# Patient Record
Sex: Female | Born: 1950
Health system: Southern US, Community
[De-identification: ages and names within clinical notes are randomized; demographics above are authoritative.]

## PROBLEM LIST (undated history)

## (undated) DIAGNOSIS — K589 Irritable bowel syndrome without diarrhea: Secondary | ICD-10-CM

## (undated) DIAGNOSIS — K219 Gastro-esophageal reflux disease without esophagitis: Secondary | ICD-10-CM

## (undated) HISTORY — PX: ABDOMINAL HYSTERECTOMY: SHX81

## (undated) HISTORY — PX: OOPHORECTOMY: SHX86

## (undated) HISTORY — DX: Irritable bowel syndrome, unspecified: K58.9

## (undated) HISTORY — DX: Gastro-esophageal reflux disease without esophagitis: K21.9

---

## 1978-11-18 HISTORY — PX: LIPOMA EXCISION: SHX5283

## 1988-11-18 HISTORY — PX: OTHER SURGICAL HISTORY: SHX169

## 2005-03-13 ENCOUNTER — Encounter: Payer: Self-pay | Admitting: Internal Medicine

## 2005-03-13 LAB — CONVERTED CEMR LAB

## 2005-10-14 ENCOUNTER — Ambulatory Visit: Payer: Self-pay | Admitting: Internal Medicine

## 2006-01-08 ENCOUNTER — Ambulatory Visit: Payer: Self-pay | Admitting: *Deleted

## 2006-04-01 ENCOUNTER — Ambulatory Visit: Payer: Self-pay | Admitting: Internal Medicine

## 2006-04-18 ENCOUNTER — Ambulatory Visit: Payer: Self-pay | Admitting: Internal Medicine

## 2006-04-19 ENCOUNTER — Emergency Department: Payer: Self-pay | Admitting: Emergency Medicine

## 2006-04-22 LAB — HM MAMMOGRAPHY: HM Mammogram: NORMAL

## 2006-09-03 ENCOUNTER — Ambulatory Visit: Payer: Self-pay | Admitting: Internal Medicine

## 2006-09-25 ENCOUNTER — Ambulatory Visit: Payer: Self-pay | Admitting: Internal Medicine

## 2007-01-19 ENCOUNTER — Ambulatory Visit: Payer: Self-pay | Admitting: Family Medicine

## 2007-05-07 ENCOUNTER — Encounter: Payer: Self-pay | Admitting: Internal Medicine

## 2007-05-07 ENCOUNTER — Ambulatory Visit: Payer: Self-pay | Admitting: Obstetrics and Gynecology

## 2007-05-21 ENCOUNTER — Ambulatory Visit: Payer: Self-pay | Admitting: Obstetrics and Gynecology

## 2007-06-07 ENCOUNTER — Emergency Department: Payer: Self-pay | Admitting: Emergency Medicine

## 2007-06-07 ENCOUNTER — Other Ambulatory Visit: Payer: Self-pay

## 2007-06-08 ENCOUNTER — Ambulatory Visit: Payer: Self-pay | Admitting: Internal Medicine

## 2007-06-08 DIAGNOSIS — K219 Gastro-esophageal reflux disease without esophagitis: Secondary | ICD-10-CM | POA: Insufficient documentation

## 2007-06-08 DIAGNOSIS — E785 Hyperlipidemia, unspecified: Secondary | ICD-10-CM | POA: Insufficient documentation

## 2007-07-28 ENCOUNTER — Ambulatory Visit: Payer: Self-pay | Admitting: Internal Medicine

## 2008-02-29 ENCOUNTER — Telehealth (INDEPENDENT_AMBULATORY_CARE_PROVIDER_SITE_OTHER): Payer: Self-pay | Admitting: *Deleted

## 2008-03-24 ENCOUNTER — Ambulatory Visit: Payer: Self-pay | Admitting: Internal Medicine

## 2008-03-28 LAB — CONVERTED CEMR LAB
AST: 20 units/L (ref 0–37)
Albumin: 3.5 g/dL (ref 3.5–5.2)
Alkaline Phosphatase: 122 units/L — ABNORMAL HIGH (ref 39–117)
Basophils Relative: 0.6 % (ref 0.0–1.0)
Calcium: 9.4 mg/dL (ref 8.4–10.5)
Creatinine, Ser: 0.7 mg/dL (ref 0.4–1.2)
Eosinophils Relative: 2.1 % (ref 0.0–5.0)
GFR calc Af Amer: 112 mL/min
GFR calc non Af Amer: 92 mL/min
Hemoglobin: 13.4 g/dL (ref 12.0–15.0)
Lymphocytes Relative: 20.1 % (ref 12.0–46.0)
MCV: 84.1 fL (ref 78.0–100.0)
Neutro Abs: 6.3 10*3/uL (ref 1.4–7.7)
Neutrophils Relative %: 68.6 % (ref 43.0–77.0)
Phosphorus: 3.8 mg/dL (ref 2.3–4.6)
RBC: 4.72 M/uL (ref 3.87–5.11)
Sed Rate: 31 mm/hr — ABNORMAL HIGH (ref 0–22)
Sodium: 141 meq/L (ref 135–145)
Total Protein: 7 g/dL (ref 6.0–8.3)
WBC: 9.2 10*3/uL (ref 4.5–10.5)

## 2008-04-06 ENCOUNTER — Encounter: Payer: Self-pay | Admitting: Internal Medicine

## 2008-04-06 DIAGNOSIS — K589 Irritable bowel syndrome without diarrhea: Secondary | ICD-10-CM | POA: Insufficient documentation

## 2008-04-14 ENCOUNTER — Ambulatory Visit: Payer: Self-pay | Admitting: Internal Medicine

## 2008-04-14 DIAGNOSIS — M810 Age-related osteoporosis without current pathological fracture: Secondary | ICD-10-CM | POA: Insufficient documentation

## 2008-06-16 ENCOUNTER — Encounter: Payer: Self-pay | Admitting: Internal Medicine

## 2008-06-16 ENCOUNTER — Encounter: Payer: Self-pay | Admitting: Cardiovascular Disease

## 2008-06-18 IMAGING — CR DG CHEST 2V
1 series · 2 of 2 positions shown · non-contrast
Comparison: none

REASON FOR EXAM: cp
COMMENTS:

PROCEDURE:     DXR - DXR CHEST PA (OR AP) AND LATERAL  - June 07, 2007  [DATE]
RESULT:     Comparison: No available comparison exam.

[Series 1: view not recorded · 0.17mm/px · 2 of 2 slices shown]
[im 1/2]
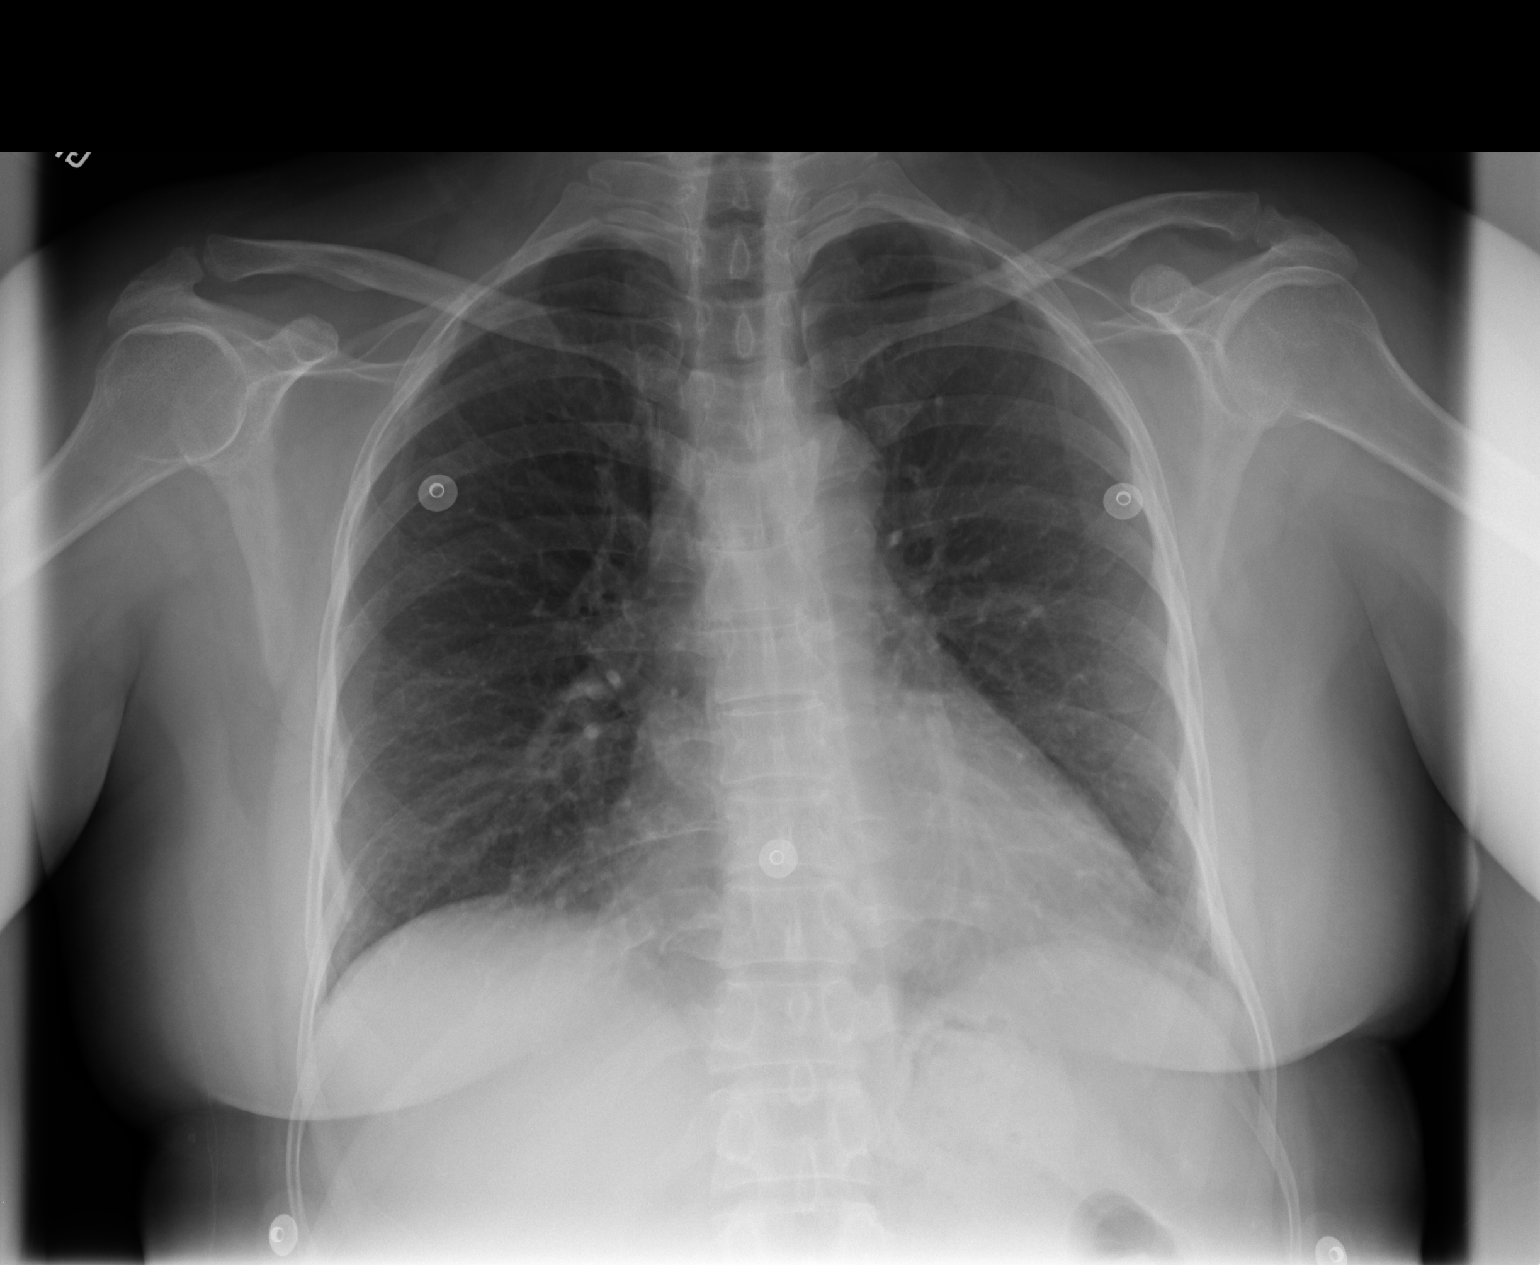
[im 2/2]
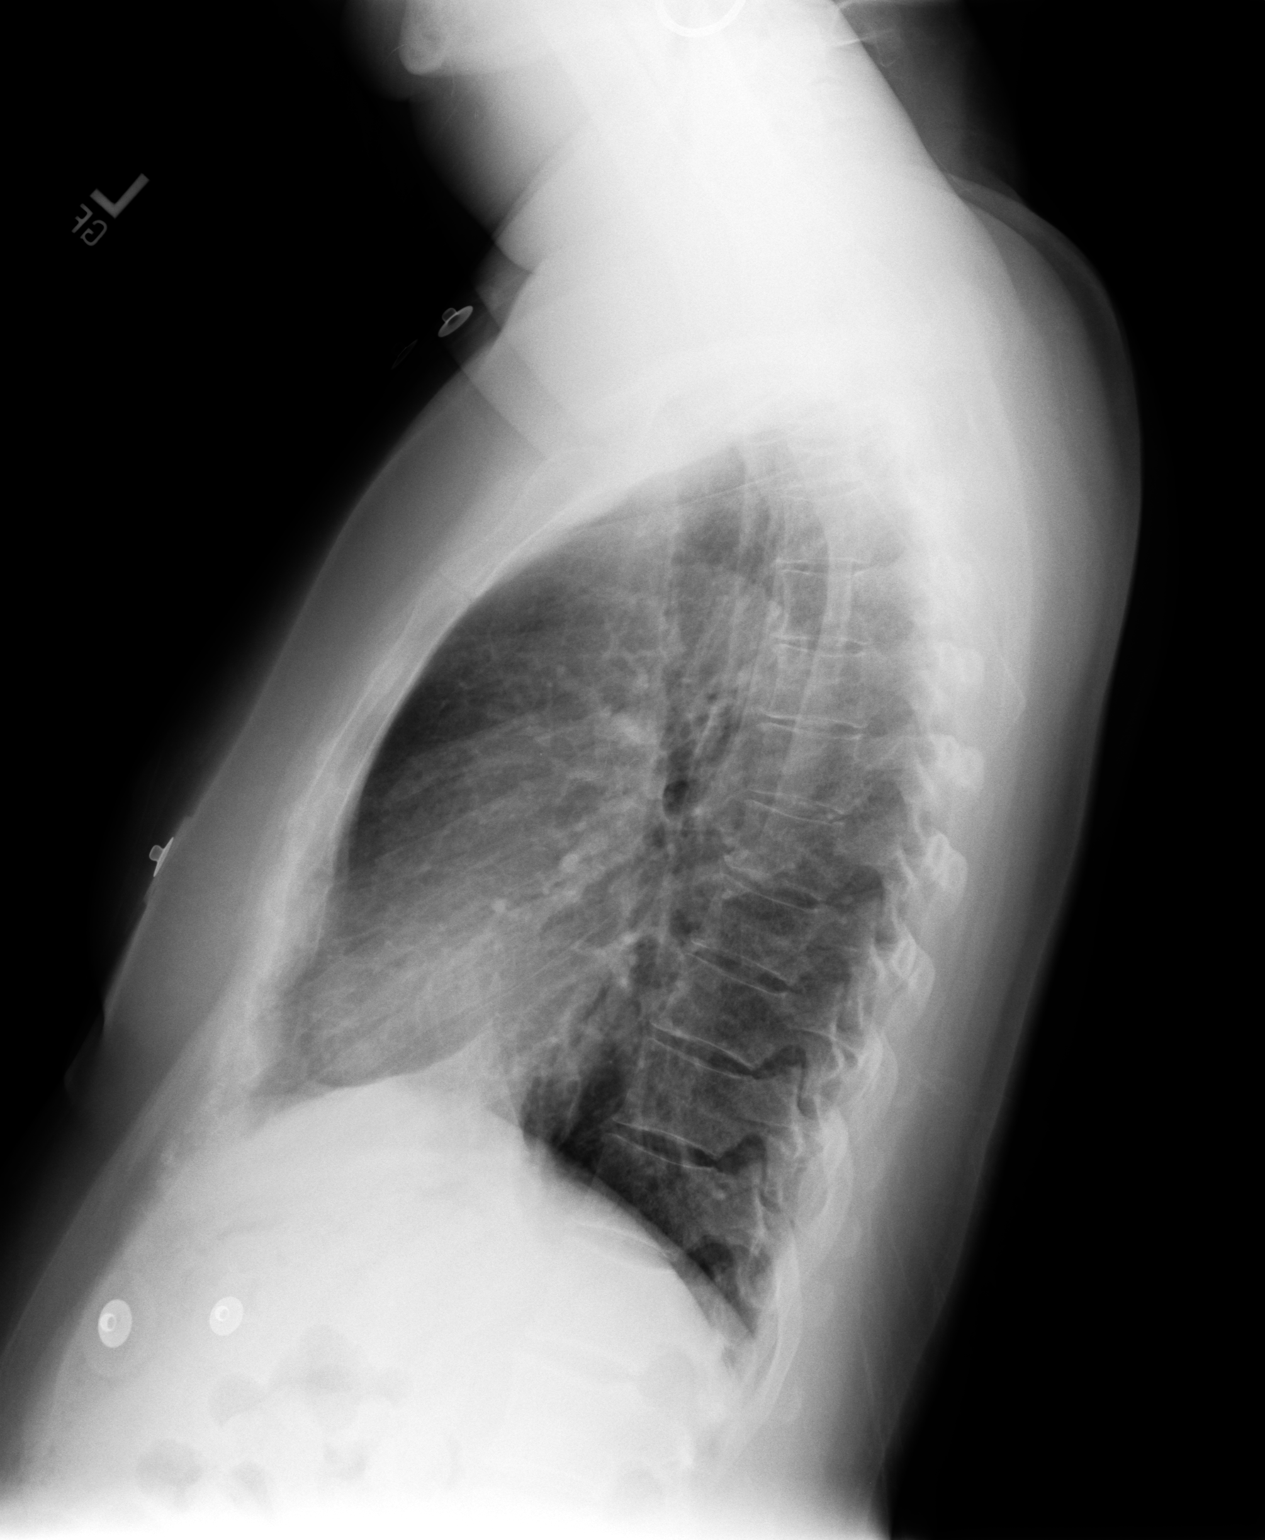

[2 of 2 positions shown; findings below may reference images not displayed]

FINDINGS: Possible emphysematous changes of the upper lungs. There is no significant
pulmonary consolidation, pulmonary edema, pleural effusion, nor
pneumothorax. The heart is borderline in size to mildly enlarged.

No displaced rib fracture is noted.
IMPRESSION: 1. Possible emphysematous changes of the upper lungs.
2. The heart is borderline sized to mildly enlarged.

## 2008-06-23 ENCOUNTER — Telehealth: Payer: Self-pay | Admitting: Internal Medicine

## 2008-07-07 ENCOUNTER — Telehealth: Payer: Self-pay | Admitting: Internal Medicine

## 2008-07-14 ENCOUNTER — Ambulatory Visit: Payer: Self-pay | Admitting: Cardiovascular Disease

## 2008-07-20 ENCOUNTER — Ambulatory Visit: Payer: Self-pay

## 2008-07-20 ENCOUNTER — Encounter: Payer: Self-pay | Admitting: Internal Medicine

## 2008-08-01 ENCOUNTER — Ambulatory Visit: Payer: Self-pay | Admitting: Internal Medicine

## 2008-08-03 LAB — CONVERTED CEMR LAB
BUN: 18 mg/dL (ref 6–23)
CO2: 30 meq/L (ref 19–32)
Chloride: 109 meq/L (ref 96–112)
Creatinine, Ser: 0.7 mg/dL (ref 0.4–1.2)
GFR calc Af Amer: 112 mL/min
Glucose, Bld: 81 mg/dL (ref 70–99)

## 2008-08-04 ENCOUNTER — Ambulatory Visit: Payer: Self-pay | Admitting: Obstetrics and Gynecology

## 2008-08-15 ENCOUNTER — Ambulatory Visit: Payer: Self-pay | Admitting: Internal Medicine

## 2008-11-14 ENCOUNTER — Ambulatory Visit: Payer: Self-pay | Admitting: Family Medicine

## 2008-12-26 ENCOUNTER — Ambulatory Visit: Payer: Self-pay | Admitting: Family Medicine

## 2009-05-01 ENCOUNTER — Ambulatory Visit: Payer: Self-pay | Admitting: Internal Medicine

## 2009-05-03 ENCOUNTER — Ambulatory Visit: Payer: Self-pay | Admitting: Obstetrics and Gynecology

## 2009-05-12 ENCOUNTER — Inpatient Hospital Stay: Payer: Self-pay | Admitting: Obstetrics and Gynecology

## 2009-08-16 IMAGING — MG MM CAD SCREENING MAMMO
1 series · 4 of 4 positions shown · non-contrast
Comparison: none

REASON FOR EXAM: Screening Mammo
COMMENTS:

PROCEDURE:     MAM - MAM DGTL SCREENING MAMMO W/CAD  - August 04, 2008  [DATE]
RESULT:       No dominant masses or pathologic clustered calcifications are
demonstrated.   The exam is stable from prior exams. CAD evaluation is
nonfocal.

[R CC · right · 4 of 4 slices shown]
[im 1/4]
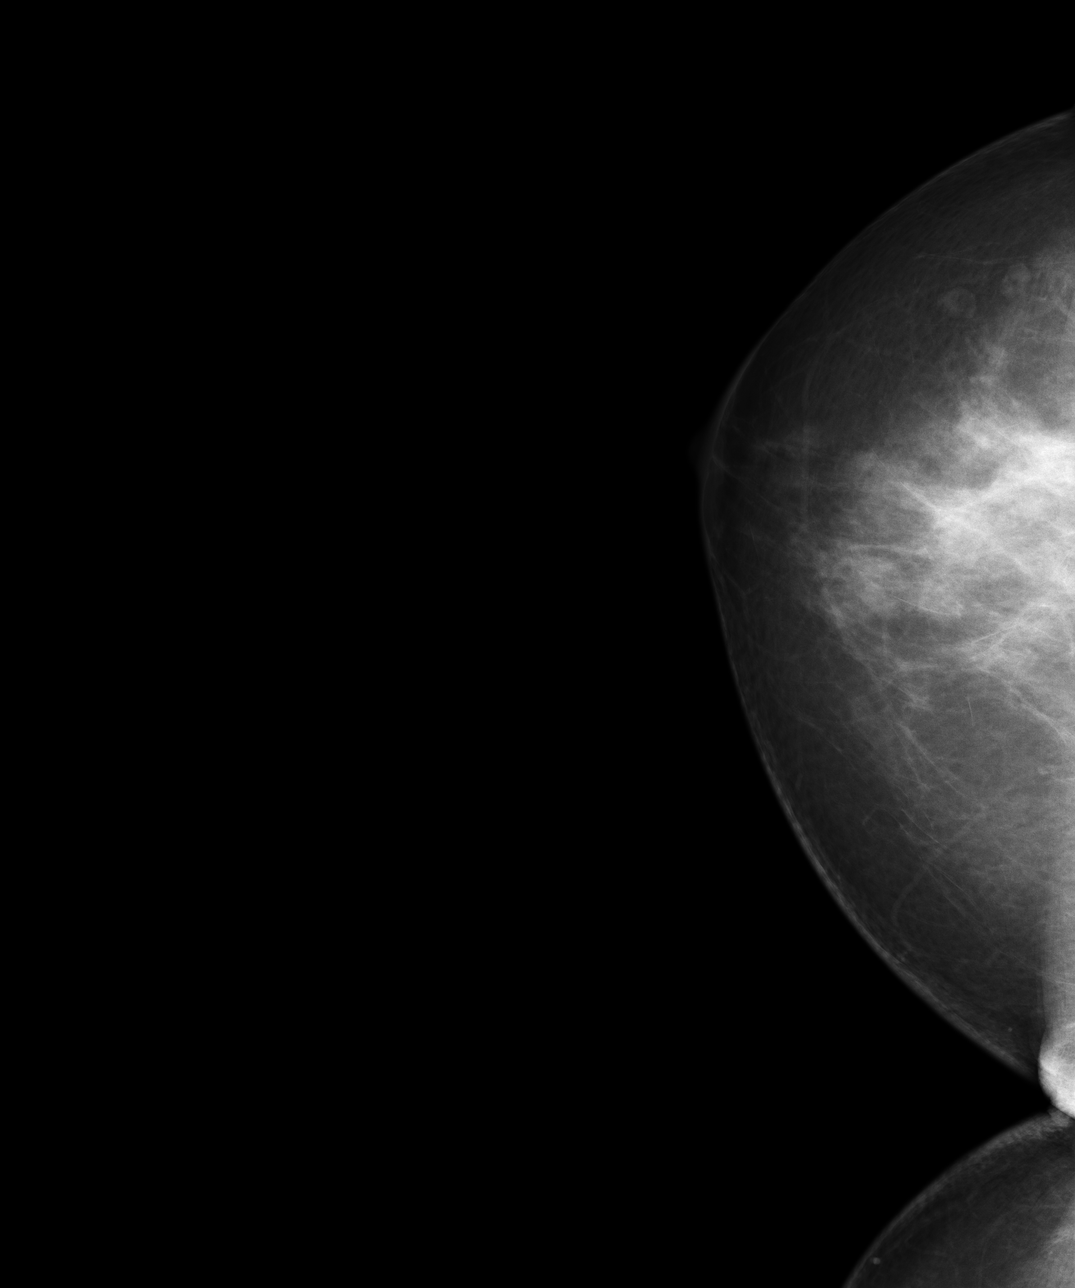
[im 2/4]
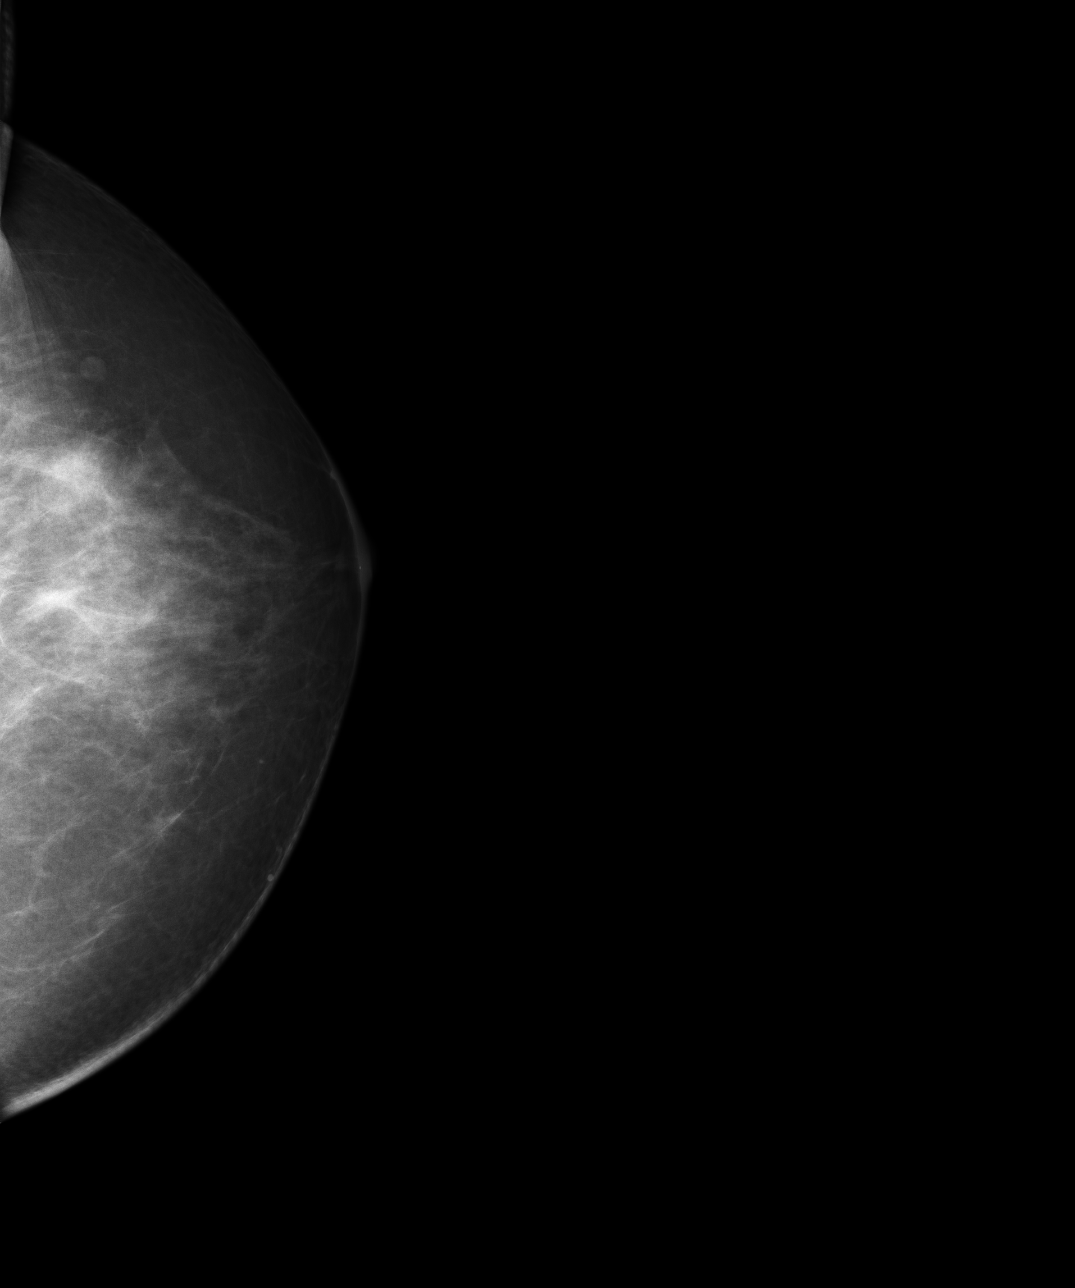
[im 3/4]
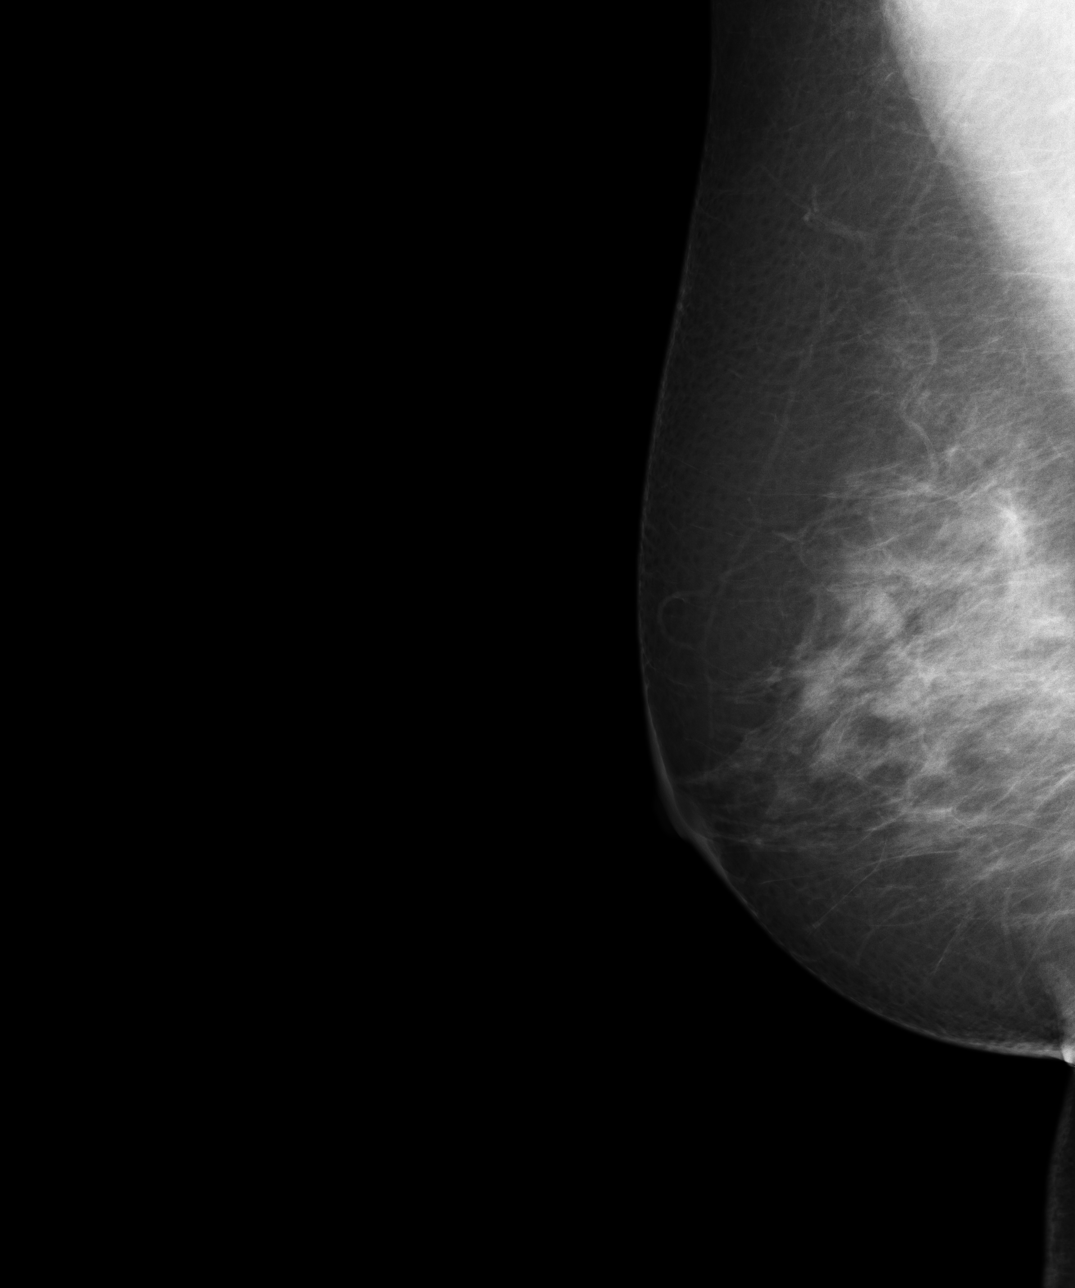
[im 4/4]
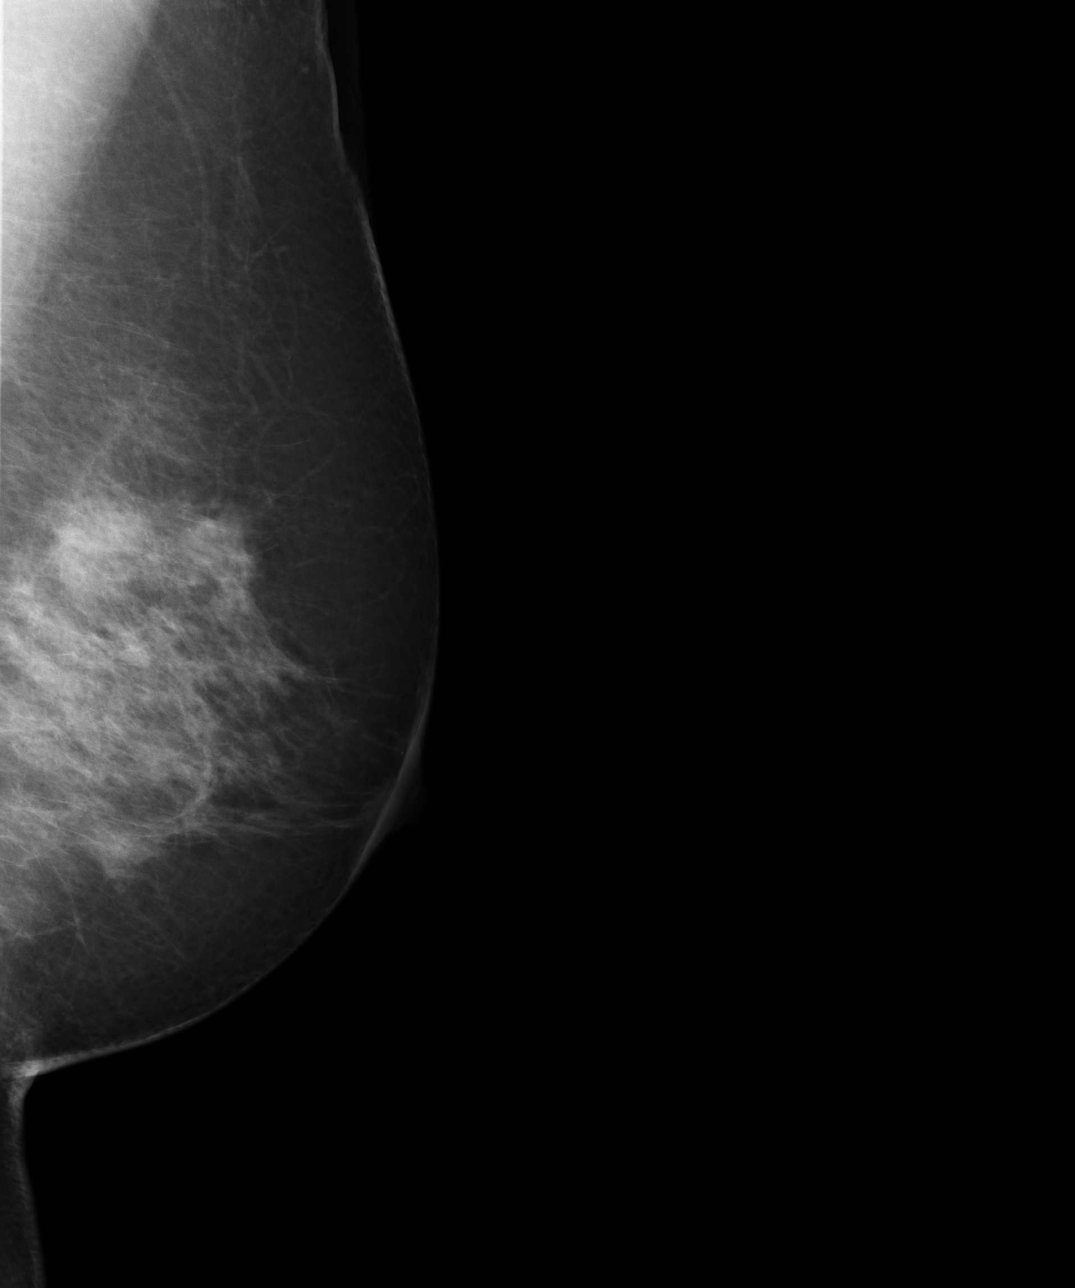

[4 of 4 positions shown; findings below may reference images not displayed]

IMPRESSION: 1.     Stable benign exam.
2.     Routine yearly follow-up mammograms suggested.
3.     BI-RADS:  Category 2-Benign Finding.

A NEGATIVE MAMMOGRAM REPORT DOES NOT PRECLUDE BIOPSY OR OTHER EVALUATION OF
A CLINICALLY PALPABLE OR OTHERWISE SUSPICIOUS MASS OR LESION.  BREAST CANCER
MAY NOT BE DETECTED BY MAMMOGRAPHY IN UP TO 10% OF CASES.

## 2009-08-24 ENCOUNTER — Telehealth (INDEPENDENT_AMBULATORY_CARE_PROVIDER_SITE_OTHER): Payer: Self-pay | Admitting: *Deleted

## 2009-08-24 ENCOUNTER — Ambulatory Visit: Payer: Self-pay | Admitting: Internal Medicine

## 2009-09-08 ENCOUNTER — Ambulatory Visit: Payer: Self-pay | Admitting: Internal Medicine

## 2009-09-11 LAB — CONVERTED CEMR LAB
ALT: 15 units/L (ref 0–35)
CO2: 23 meq/L (ref 19–32)
Calcium: 8.8 mg/dL (ref 8.4–10.5)
Chloride: 103 meq/L (ref 96–112)
Cholesterol: 224 mg/dL — ABNORMAL HIGH (ref 0–200)
Creatinine, Ser: 0.77 mg/dL (ref 0.40–1.20)
Eosinophils Absolute: 0.1 10*3/uL (ref 0.0–0.7)
Hemoglobin: 13.8 g/dL (ref 12.0–15.0)
Lymphs Abs: 2.4 10*3/uL (ref 0.7–4.0)
MCV: 84.9 fL (ref 78.0–100.0)
Monocytes Absolute: 1.1 10*3/uL — ABNORMAL HIGH (ref 0.1–1.0)
Monocytes Relative: 10 % (ref 3–12)
Neutrophils Relative %: 67 % (ref 43–77)
RBC: 4.96 M/uL (ref 3.87–5.11)
Sodium: 140 meq/L (ref 135–145)
Total Protein: 7.2 g/dL (ref 6.0–8.3)
WBC: 10.9 10*3/uL — ABNORMAL HIGH (ref 4.0–10.5)

## 2009-09-22 ENCOUNTER — Encounter: Payer: Self-pay | Admitting: Internal Medicine

## 2009-09-28 ENCOUNTER — Encounter: Payer: Self-pay | Admitting: Family Medicine

## 2009-09-28 ENCOUNTER — Ambulatory Visit: Payer: Self-pay | Admitting: Family Medicine

## 2009-10-02 ENCOUNTER — Emergency Department: Payer: Self-pay | Admitting: Emergency Medicine

## 2009-10-04 ENCOUNTER — Encounter (INDEPENDENT_AMBULATORY_CARE_PROVIDER_SITE_OTHER): Payer: Self-pay | Admitting: *Deleted

## 2009-10-05 ENCOUNTER — Ambulatory Visit: Payer: Self-pay | Admitting: Internal Medicine

## 2009-10-17 ENCOUNTER — Ambulatory Visit: Payer: Self-pay | Admitting: Internal Medicine

## 2009-10-17 LAB — HM COLONOSCOPY

## 2010-04-24 ENCOUNTER — Emergency Department: Payer: Self-pay | Admitting: Emergency Medicine

## 2010-08-14 ENCOUNTER — Ambulatory Visit: Payer: Self-pay | Admitting: Internal Medicine

## 2010-08-14 DIAGNOSIS — L570 Actinic keratosis: Secondary | ICD-10-CM | POA: Insufficient documentation

## 2010-08-15 LAB — CONVERTED CEMR LAB
Cholesterol: 248 mg/dL — ABNORMAL HIGH (ref 0–200)
Glucose, Bld: 102 mg/dL — ABNORMAL HIGH (ref 70–99)
HDL: 49 mg/dL (ref >39.00)
VLDL: 57.4 mg/dL — ABNORMAL HIGH (ref 0.0–40.0)

## 2010-08-21 ENCOUNTER — Telehealth: Payer: Self-pay | Admitting: Internal Medicine

## 2010-10-10 ENCOUNTER — Emergency Department: Payer: Self-pay | Admitting: Emergency Medicine

## 2010-12-18 NOTE — Progress Notes (Signed)
Summary: regarding mole  Phone Note Call from Patient Call back at Home Phone [phone] Call back at [phone]   Caller: Patient Call For: Cindee Salt MD Summary of Call: Pt had a mole frozen off her leg last week and she says now this looks like a big blister.  I advised pt that is not unusual. She is asking how long is it going to take for this to go away.  She said it does feel tender to the touch. Initial call taken by: Lowella Petties CMA,  August 21, 2010 10:44 AM  Follow-up for Phone Call        the blister will either break or slough off and eventually it will scab over then heal the total process is generally over in 2-3 weeks Follow-up by: Cindee Salt MD,  August 21, 2010 1:39 PM  Additional Follow-up for Phone Call Additional follow up Details #1::        Spoke with patient and advised results.  Additional Follow-up by: Mervin Hack CMA Duncan Dull),  August 21, 2010 3:03 PM

## 2010-12-18 NOTE — Assessment & Plan Note (Signed)
Summary: follow up/wants labs/alc   Vital Signs:  Patient profile:   60 year old female Weight:      164 pounds BMI:     31.10 Temp:     98.7 degrees F oral Pulse rate:   64 / minute Pulse rhythm:   regular BP sitting:   110 / 70  (left arm) Cuff size:   regular  Vitals Entered By: Mervin Hack CMA Duncan Dull) (August 14, 2010 9:10 AM) CC: follow-up visit   History of Present Illness: Has open enrollment for work Needs labs for them--chol and glucose??  Has been concerned about an enlarging mole on right calf She remembers it from way back Seems to have gotten bigger in past year Gets cut during shaving and bleeds  Occ pain in leg but doesn't seem to be related to mole No itching  Stomach has been fine hasn't needed the zantac recently No swallowing problems  No longer sees gyn  Allergies: 1)  ! Sulfa 2)  Boniva (Ibandronate Sodium)  Past History:  Past medical, surgical, family and social histories (including risk factors) reviewed for relevance to current acute and chronic problems.  Past Medical History: Reviewed history from 04/27/2009 and no changes required. GERD Hyperlipidemia IBS Osteoporosis  Past Surgical History: Reviewed history from 09/08/2009 and no changes required. C-section 1983 Left knee arthroscopy  ~1990 Left thigh lipoma 1980's EGD/Colon 07/06 Hysterectomy/BSO-----Dr Knowles-Jonas  Family History: Reviewed history from 08/01/2008 and no changes required. Dad died in 61s of COPD Mom with DM, ?atrial fib, ear tumor (?Schwanomma), skin cancer 1 sister with MVP, melanoma No CAD ?HTN in Dad No breast or colon cancer  Social History: Reviewed history from 04/27/2009 and no changes required. Occupation: full time at [employer] son Never Smoked Alcohol use-no Regular Exercise - yes  Review of Systems       No longer has cough No SOB weigth down 4#  Physical Exam  General:  alert and normal appearance.     Abdomen:  soft, non-tender, and no masses.   Skin:  slightly raised 3-80mm lesion on lateral right calf Mild redness, sl scaling Psych:  normally interactive, good eye contact, not anxious appearing, and not depressed appearing.     Impression & Recommendations:  Problem # 1:  HYPERLIPIDEMIA (ICD-272.4) Assessment Comment Only  discussed eating right and increased exercise  Labs Reviewed: SGOT: 17 (09/08/2009)   SGPT: 15 (09/08/2009)   HDL:48 (09/08/2009)  LDL:121 (09/08/2009)  Chol:224 (09/08/2009)  Trig:274 (09/08/2009)  Orders: TLB-Lipid Panel (80061-LIPID) Venipuncture (16109)  Problem # 2:  GERD (ICD-530.81) Assessment: Improved hasn't needed the meds lately no cough  Her updated medication list for this problem includes:    Zantac 150 Maximum Strength 150 Mg Tabs (Ranitidine hcl) .Marland Kitchen... 1 tablet by mouth two times a day for heartburn  Problem # 3:  ACTINIC KERATOSIS (ICD-702.0) Assessment: New  not classic appearing actinic but some changes Liquid nitrogen 40 seconds x 2---discussed home care  Orders: Cryotherapy/Destruction benign or premalignant lesion (1st lesion)  (17000)  Complete Medication List: 1)  Caltrate 600+d Plus 600-400 Mg-unit Tabs (Calcium carbonate-vit d-min) .Marland Kitchen.. 1 tab by mouth two times a day 2)  Zantac 150 Maximum Strength 150 Mg Tabs (Ranitidine hcl) .Marland Kitchen.. 1 tablet by mouth two times a day for heartburn  Other Orders: TLB-Glucose, QUANT (82947-GLU)  Patient Instructions: 1)  Please schedule a follow-up appointment in 6 months for physical 2)  Your blood pressure today was 110/70  Current Allergies (reviewed today): !  SULFA BONIVA (IBANDRONATE SODIUM)  Appended Document: follow up/wants labs/alc    Clinical Lists Changes  Orders: Added new Service order of Flu Vaccine 26yrs + [phone]) - Signed Added new Service order of Admin 1st Vaccine (27253) - Signed Added new Service order of Admin 1st Vaccine [facility]) [phone]) -  Signed Observations: Added new observation of FLU VAX#1VIS: 06/12/10 version given August 14, 2010. (08/14/2010 9:42) Added new observation of FLU VAXLOT: [id number] (08/14/2010 9:42) Added new observation of FLU VAX EXP: 05/18/2011 (08/14/2010 9:42) Added new observation of FLU VAXBY: DeShannon Smith CMA (AAMA) (08/14/2010 9:42) Added new observation of FLU VAXRTE: IM (08/14/2010 9:42) Added new observation of FLU VAX DSE: 0.5 ml (08/14/2010 9:42) Added new observation of FLU VAXMFR: GlaxoSmith[employer] (08/14/2010 9:42) Added new observation of FLU VAX SITE: left deltoid (08/14/2010 9:42) Added new observation of FLU VAX: Fluvax 3+ (08/14/2010 9:42)       Influenza Vaccine    Vaccine Type: Fluvax 3+    Site: left deltoid    Mfr: GlaxoSmithKline    Dose: 0.5 ml    Route: IM    Given by: Mervin Hack CMA (AAMA)    Exp. Date: 05/18/2011    Lot #: [id number]    VIS given: 06/12/10 version given August 14, 2010.  Flu Vaccine Consent Questions    Do you have a history of severe allergic reactions to this vaccine? no    Any prior history of allergic reactions to egg and/or gelatin? no    Do you have a sensitivity to the preservative Thimersol? no    Do you have a past history of Guillan-Barre Syndrome? no    Do you currently have an acute febrile illness? no    Have you ever had a severe reaction to latex? no    Vaccine information given and explained to patient? yes    Are you currently pregnant? no

## 2010-12-26 ENCOUNTER — Encounter: Payer: Self-pay | Admitting: Internal Medicine

## 2011-02-12 ENCOUNTER — Encounter: Payer: Self-pay | Admitting: Internal Medicine

## 2011-02-12 ENCOUNTER — Ambulatory Visit (INDEPENDENT_AMBULATORY_CARE_PROVIDER_SITE_OTHER): Payer: 59 | Admitting: Internal Medicine

## 2011-02-12 VITALS — BP 126/79 | HR 81 | Temp 98.3°F | Ht 61.0 in | Wt 163.0 lb

## 2011-02-12 DIAGNOSIS — E785 Hyperlipidemia, unspecified: Secondary | ICD-10-CM

## 2011-02-12 DIAGNOSIS — K219 Gastro-esophageal reflux disease without esophagitis: Secondary | ICD-10-CM

## 2011-02-12 DIAGNOSIS — M810 Age-related osteoporosis without current pathological fracture: Secondary | ICD-10-CM

## 2011-02-12 DIAGNOSIS — Z1231 Encounter for screening mammogram for malignant neoplasm of breast: Secondary | ICD-10-CM

## 2011-02-12 DIAGNOSIS — Z0000 Encounter for general adult medical examination without abnormal findings: Secondary | ICD-10-CM

## 2011-02-12 LAB — CBC WITH DIFFERENTIAL/PLATELET
Basophils Relative: 0.6 % (ref 0.0–3.0)
Eosinophils Relative: 1.9 % (ref 0.0–5.0)
HCT: 40.8 % (ref 36.0–46.0)
Hemoglobin: 13.7 g/dL (ref 12.0–15.0)
Lymphs Abs: 2 10*3/uL (ref 0.7–4.0)
MCV: 85.3 fl (ref 78.0–100.0)
Monocytes Absolute: 0.8 10*3/uL (ref 0.1–1.0)
Monocytes Relative: 8.5 % (ref 3.0–12.0)
Neutro Abs: 6.1 10*3/uL (ref 1.4–7.7)
RBC: 4.78 Mil/uL (ref 3.87–5.11)
WBC: 9.2 10*3/uL (ref 4.5–10.5)

## 2011-02-12 LAB — HEPATIC FUNCTION PANEL
ALT: 21 U/L (ref 0–35)
AST: 27 U/L (ref 0–37)
Total Protein: 7.1 g/dL (ref 6.0–8.3)

## 2011-02-12 LAB — BASIC METABOLIC PANEL
BUN: 18 mg/dL (ref 6–23)
Chloride: 108 mEq/L (ref 96–112)
Potassium: 5.4 mEq/L — ABNORMAL HIGH (ref 3.5–5.1)
Sodium: 146 mEq/L — ABNORMAL HIGH (ref 135–145)

## 2011-02-12 NOTE — Progress Notes (Signed)
Subjective:    Patient ID: Theresa Huber, female    DOB: November 24, 1950, 60 y.o.   MRN: [id number]  HPI DOing okay No longer seeing gyn  Having some trouble sleeping Will awake choking at night Wonders about apnea--but is choking, not apneic apparently Husband does note snoring Several times this past week Ongoing indigestion  Occ daytime lightheadedness also No syncope  Past Medical History  Diagnosis Date  . GERD (gastroesophageal reflux disease)   . Osteoporosis   . IBS (irritable bowel syndrome)   . HLD (hyperlipidemia)     Past Surgical History  Procedure Date  . Cesarean section 1983  . Abdominal hysterectomy   . Knee arthroscopy 1990    LEFT  . Lipoma excision 1980    LEFT THIGH    Family History  Problem Relation Age of Onset  . Diabetes Mother   . Heart disease Mother     ? A FIB  . Cancer Mother     ? sCHWANOMMA, SKIN CANCER  . COPD Father   . Hyperlipidemia Father   . Mitral valve prolapse Sister   . Cancer Sister     MELANOMA    History   Social History  . Marital Status: Married    Spouse Name: N/A    Number of Children: 1  . Years of Education: N/A   Occupational History  .  [employer]   Social History Main Topics  . Smoking status: Former Smoker -- 2.0 packs/day    Types: Cigarettes    Quit date: 11/18/1984  . Smokeless tobacco: Never Used  . Alcohol Use: No  . Drug Use: No  . Sexually Active: Not on file   Other Topics Concern  . Not on file   Social History Narrative  . No narrative on file      Review of Systems  Constitutional: Negative for appetite change and fatigue.       Weight is down a few pounds No set exercise other than constantly walking at work Wears seat belt  HENT: Negative for hearing loss, dental problem and tinnitus.        Regular with dentist  Eyes: Negative for redness and visual disturbance.       No vision loss  Respiratory: Positive for cough. Negative for shortness of breath.        Occ  dry cough--both day and night  Cardiovascular: Negative for chest pain, palpitations and leg swelling.  Gastrointestinal: Positive for blood in stool. Negative for abdominal pain and constipation.       Indigestion Occ blood on toilet paper or toilet---relates to hemorrhoid. Just had reassuring colonoscopy  Genitourinary: Negative for dysuria and difficulty urinating.       No incontinence No sex--no problem Self breast exam benign  Musculoskeletal: Positive for arthralgias. Negative for back pain and joint swelling.       Stiffness in legs No meds  Skin:       No rashes or suspicious areas    Neurological: Positive for dizziness. Negative for syncope, weakness, numbness and headaches.  Hematological: Negative for adenopathy. Does not bruise/bleed easily.  Psychiatric/Behavioral: Positive for sleep disturbance. Negative for dysphoric mood. The patient is not nervous/anxious.        Does toss and turn at night. Some daytime somnolence but no excessive sleeping       Objective:   Physical Exam  Constitutional: She is oriented to person, place, and time. She appears well-developed and well-nourished. No distress.  Neck:  Normal range of motion. Neck supple. No thyromegaly present.  Cardiovascular: Normal rate, regular rhythm, normal heart sounds and intact distal pulses.  Exam reveals no gallop.   No murmur heard. Pulmonary/Chest: Effort normal and breath sounds normal. She has no wheezes. She has no rales.  Abdominal: Soft. She exhibits no distension and no mass. There is no tenderness.  Genitourinary:       Breast exam--no masses or tenderness  Musculoskeletal: Normal range of motion. She exhibits no edema and no tenderness.  Lymphadenopathy:    She has no cervical adenopathy.    She has no axillary adenopathy.  Neurological: She is alert and oriented to person, place, and time.       Normal strength and gait  Skin: Skin is warm. No rash noted. No erythema.  Psychiatric: She  has a normal mood and affect. Judgment and thought content normal.          Assessment & Plan:

## 2011-02-12 NOTE — Patient Instructions (Signed)
Please use the omeprazole (prilosec) 20mg  at bedtime for the next 4-6 weeks. If the nighttime problems resolve, you can either continue this, or change back to ranitidine at a dose of 150mg  twice a day

## 2011-03-13 ENCOUNTER — Ambulatory Visit: Payer: Self-pay | Admitting: Internal Medicine

## 2011-03-14 ENCOUNTER — Encounter: Payer: Self-pay | Admitting: *Deleted

## 2011-04-02 NOTE — Letter (Signed)
July 14, 2008    Theresa Schwalbe, MD  [address] [place], [place] [address]   RE:  Theresa Huber  MRN:  [id number]  /  DOB:  23-Jun-1951   Dear Dr. Alphonsus Huber:   It was my pleasure to see Theresa Huber for evaluation of chest pain  on July 14, 2008.   As you know, Theresa Huber is a 60 year old woman with no history of  cardiac problems who has had 2 prolonged episodes of substernal chest  pain.  Her initial episode was approximately 1 month ago where she  developed sharp substernal chest discomfort that waxed and waned for  approximately 2 days.  The pain did not radiate.  It was not affected by  activity level or exertion.  She denied any associated symptoms,  specifically there was no dyspnea, diaphoresis, nausea, vomiting, or  lightheadedness.  She notes that this occurred approximately 2 weeks  after taking her first dose of Boniva, which she started for  osteoporosis.  Her symptoms resolved and she had no further problems  until approximately 3 weeks later when she developed second episode of  severe sharp substernal chest pain that lasted for several hours.  The  pain started at rest and again was nonradiating with no associated  symptoms.  She reports the following day that she felt soreness in her  chest.  This occurred approximately 1 week after her second dose of  Boniva.  She has avoided taking any subsequent doses of this medication  because she has been concerned that this may be the etiology of her  chest pain.  She continues to be active as she works at [employer].  She  is not engaged in formal exercise, but she is on her feet for several  hours daily and regularly carries merchandise with no symptoms of chest  pain, dyspnea, or other complaints.  She also denies palpitations,  orthopnea, PND, edema, or syncope.  She presented to the emergency  department after the second episode of chest pain and was ruled out for  myocardial infarction.  She had  normal cardiac enzymes.  Her EKGs were  also normal.  She was observed for several hours and ultimately  discharged home.   MEDICATIONS:  1. Calcium 1000 mg daily.  2. Vitamin D one daily.  3. Glucosamine one daily.  4. Ibuprofen 3 daily.   ALLERGIES:  SULFA drugs and LATEX.   PAST MEDICAL HISTORY:  1. Gastroesophageal reflux disease.  2. Borderline hyperlipidemia.  3. Osteoporosis.  4. Remote C-section.  5. Left knee arthroscopy in 1996.  6. Irritable bowel syndrome.   SOCIAL HISTORY:  The patient is married.  She has one grown child.  She  works as a Economist.  She is a former smoker, but quit  approximately 15 years ago.  She smoked 2 packs per day for  approximately 10-15 years on and off.  She does not drink alcohol or use  illicit drugs.   FAMILY HISTORY:  The patient's mother has atrial fibrillation and is on  chronic Coumadin.  She has no history of coronary disease.  Her father  died in his 30s from COPD.  She has a sister who has mitral valve  disease.  There is no coronary artery disease or myocardial infarction  history in the family.   REVIEW OF SYSTEMS:  A complete 12-point review of systems was performed  other than occasional constipation and diarrhea as well as neck  pain and  leg cramps that occur at night.  Review of systems was negative.  She  does not have exertional leg pain.   PHYSICAL EXAMINATION:  GENERAL:  The patient is alert and oriented.  She  is in no acute distress.  VITAL SIGNS:  Her blood pressure is 138/81, heart rate 77, respiratory  rate 12.  HEENT:  Normal.  NECK:  Normal carotid upstrokes without bruits.  Jugular venous pressure  is normal.  There is no thyromegaly or thyroid nodules.  LUNGS:  Clear to auscultation bilaterally.  CARDIOVASCULAR:  The apex is discrete and nondisplaced.  There is no  right ventricular heave or lift.  The heart has regular rate and rhythm.  There are no murmurs or gallops.  ABDOMEN:   Soft and nontender.  No organomegaly.  No abdominal bruits.  BACK:  No CVA tenderness.  EXTREMITIES:  No clubbing, cyanosis, or edema.  Peripheral pulses are 2+  and equal throughout.  SKIN:  Warm and dry without rash.  LYMPHATICS:  No adenopathy.  NEUROLOGIC:  Cranial nerves II through XII are intact.  Strength is  intact and equal bilaterally.   EKG shows normal sinus rhythm and is within normal limits.   ASSESSMENT:  This is a 60 year old woman with substernal chest pain of  uncertain etiology.  Her cardiac risk factors include hyperlipidemia and  history of tobacco abuse.  She is relatively at low risk in the setting  of a normal EKG and the absence of ongoing symptoms.  I think it is  reasonable to pursue noninvasive stress study to exclude significant  obstructive coronary artery disease.  She will be scheduled for an  exercise Myoview stress test.  I am hopeful that this will be normal and  I suspect her chest pain may be related to [facility].  I looked for an  association and there are reports of esophagitis, but chest pain appears  to be an uncommon reaction.   We will follow up by telephone after the results of her stress test are  available.  If her stress test demonstrates no significant ischemia, I  would recommend ongoing efforts at primary risk reduction.   Thanks again for the opportunity to see Theresa Huber.  We will be in  contact after her stress test is completed.    Sincerely,      Theresa Fells. Excell Seltzer, MD  Electronically Signed    MDC/MedQ  DD: 07/14/2008  DT: 07/15/2008  Job #: [id number]

## 2011-07-02 ENCOUNTER — Telehealth: Payer: Self-pay | Admitting: *Deleted

## 2011-07-02 NOTE — Telephone Encounter (Signed)
Pt would like referral for a sleep study.  She says she snores, doesn't wake up feeling rested.  She has tried the prilosec as suggested, but it hasnt helped.  She prefers to go somewhere in [place] for the study.

## 2011-07-02 NOTE — Telephone Encounter (Signed)
I don't just order the studies, I refer to a sleep specialist to determine the correct test Our sleep doctors have just started home sleep studies which is much easier See if she would go there, or we can go ahead with referral in [place] (need to be sure who is sleep certified in [place] before making referral)

## 2011-07-03 NOTE — Telephone Encounter (Signed)
Spoke to pts husband, will wait to speak to the patient before scheduling consult for sleep study.

## 2011-07-29 ENCOUNTER — Encounter: Payer: Self-pay | Admitting: Pulmonary Disease

## 2011-07-29 ENCOUNTER — Ambulatory Visit (INDEPENDENT_AMBULATORY_CARE_PROVIDER_SITE_OTHER): Payer: 59 | Admitting: Pulmonary Disease

## 2011-07-29 VITALS — BP 100/80 | HR 73 | Temp 98.1°F | Ht 60.0 in | Wt 164.4 lb

## 2011-07-29 DIAGNOSIS — G4733 Obstructive sleep apnea (adult) (pediatric): Secondary | ICD-10-CM | POA: Insufficient documentation

## 2011-07-29 DIAGNOSIS — Z23 Encounter for immunization: Secondary | ICD-10-CM

## 2011-07-29 NOTE — Assessment & Plan Note (Addendum)
Given excessive daytime somnolence, narrow pharyngeal exam, witnessed apneas & loud snoring, obstructive sleep apnea is very likely & an overnight polysomnogram will be scheduled as a split study. The pathophysiology of obstructive sleep apnea , it's cardiovascular consequences & modes of treatment including CPAP were discused with the patient in detail &  she evidenced understanding. Feel given her frequent awakenings, home study will be inadequate & will not assess total sleep time, may underestimate her degree of obstructive sleep apnea .

## 2011-07-29 NOTE — Patient Instructions (Signed)
Schedule sleep study

## 2011-07-29 NOTE — Progress Notes (Signed)
  Subjective:    Patient ID: Theresa Huber, female    DOB: November 16, 1951, 60 y.o.   MRN: [id number]  HPI 59/F for evaluation of obstructive sleep apnea  ESS 12/24 - always tired, sleepy in afternoons, on reading. Having some trouble sleeping ,Will awake choking at night , started in her 38's Wonders about apnea--but is choking, not apneic apparently  Husband does note snoring , BR x2  Bedtime 11p, latency minimal, several awakenings, sleeps on her side with 1-2 pillows, oob 0700 - no headaches or dryness,  1 cup coffee daily. There is no history suggestive of cataplexy, sleep paralysis or parasomnias     Review of Systems  Constitutional: Negative for fever and unexpected weight change.  HENT: Positive for congestion. Negative for ear pain, nosebleeds, sore throat, rhinorrhea, sneezing, trouble swallowing, dental problem, postnasal drip and sinus pressure.   Eyes: Negative for redness and itching.  Respiratory: Positive for shortness of breath. Negative for cough, chest tightness and wheezing.   Cardiovascular: Negative for palpitations and leg swelling.  Gastrointestinal: Negative for nausea and vomiting.  Genitourinary: Negative for dysuria.  Musculoskeletal: Positive for arthralgias. Negative for joint swelling.  Skin: Negative for rash.  Neurological: Negative for headaches.  Hematological: Does not bruise/bleed easily.  Psychiatric/Behavioral: Negative for dysphoric mood. The patient is not nervous/anxious.        Objective:   Physical Exam Gen. Pleasant, well-nourished, in no distress ENT - no lesions, no post nasal drip, class 2 airway Neck: No JVD, no thyromegaly, no carotid bruits Lungs: no use of accessory muscles, no dullness to percussion, clear without rales or rhonchi  Cardiovascular: Rhythm regular, heart sounds  normal, no murmurs or gallops, no peripheral edema Musculoskeletal: No deformities, no cyanosis or clubbing         Assessment & Plan:

## 2011-08-04 ENCOUNTER — Ambulatory Visit (HOSPITAL_BASED_OUTPATIENT_CLINIC_OR_DEPARTMENT_OTHER): Payer: 59 | Attending: Pulmonary Disease

## 2011-08-04 DIAGNOSIS — R0989 Other specified symptoms and signs involving the circulatory and respiratory systems: Secondary | ICD-10-CM | POA: Insufficient documentation

## 2011-08-04 DIAGNOSIS — G4710 Hypersomnia, unspecified: Secondary | ICD-10-CM | POA: Insufficient documentation

## 2011-08-04 DIAGNOSIS — G4733 Obstructive sleep apnea (adult) (pediatric): Secondary | ICD-10-CM

## 2011-08-04 DIAGNOSIS — R0609 Other forms of dyspnea: Secondary | ICD-10-CM | POA: Insufficient documentation

## 2011-08-09 DIAGNOSIS — R0989 Other specified symptoms and signs involving the circulatory and respiratory systems: Secondary | ICD-10-CM

## 2011-08-09 DIAGNOSIS — G4710 Hypersomnia, unspecified: Secondary | ICD-10-CM

## 2011-08-09 DIAGNOSIS — R0609 Other forms of dyspnea: Secondary | ICD-10-CM

## 2011-08-09 NOTE — Procedures (Signed)
[name], Theresa Huber           ACCOUNT NO.:  [id number]  MEDICAL RECORD NO.:  [id number]          PATIENT TYPE:  OUT  LOCATION:  SLEEP CENTER                 FACILITY:  [facility]  PHYSICIAN:  Oretha Milch, MD      DATE OF BIRTH:  1951/10/17  DATE OF STUDY:  08/04/2011                           NOCTURNAL POLYSOMNOGRAM  REFERRING PHYSICIAN:  Oretha Milch, MD  INDICATIONS:  Excessive daytime somnolence, snoring, and choking episodes during sleep in this 60 year old woman.  At the time of this study, she weighed 164 pounds with a height of 5 feet, BMI of 32, neck size of 16 inches, Epworth sleepiness score was 12.  This nocturnal polysomnogram was performed with a sleep technologist in attendance.  EEG, EOG, EMG, EKG, and respiratory parameters were recorded.  Sleep stages, arousals, limb movements, and respiratory data were scored according to criteria laid out by the American Academy of Sleep Medicine.  SLEEP ARCHITECTURE:  Lights out was at 10:22 p.m., lights on was at 4:30 a.m.  Total sleep time was 237 minutes with a sleep period time of 311 minutes and a sleep efficiency of 64%.  Sleep latency was 27 minutes and latency to REM sleep was 182 minutes.  Sleep stages of the percentage of total sleep time was N1 11%, N2 81%, N3 0%, REM sleep 8% (20 minutes). Supine sleep accounted for 36 minutes.  No supine REM sleep was noted.  AROUSAL DATA:  There were 33 spontaneous arousals with an arousal index of 80 events per hour.  RESPIRATORY DATA:  There were 0 apneas or hypopneas noted during sleep, 5 RERAs were noted with an RDI of 1.3 events per hour.  LIMB MOVEMENT DATA:  No significant limb movements were noted.  OXYGEN SATURATION DATA:  Desaturation index was 0.3 events per hour. She spent 0 minutes with a saturation less than 88%.  The lowest desaturation was 94%.  CARDIAC DATA:  No arrhythmias were noted.  The low heart rate was 30 beats per minute.  The high heart rate  recorded was an artifact.  DISCUSSION:  Poor sleep efficiency.  REM sleep was noted.  Some sleep state misperception.  She felt that she slept less than 2 hours.  IMPRESSION: 1. No evidence of sleep disordered breathing. 2. Poor sleep efficiency. 3. No evidence of cardiac arrhythmias, limb movements, or behavioral     disturbance during sleep.  RECOMMENDATIONS:  The cause of her excessive daytime somnolence is unclear from this study.  It appears no evidence of narcolepsy based on history and the REM latency and the study appears to be okay.  Explore other causes of somnolence such as deconditioning, low sleep efficiency, and inadequate sleep time.     Oretha Milch, MD Electronically Signed    RVA/MEDQ  D:  08/09/2011 15:17:51  T:  08/09/2011 23:03:52  Job:  [id number]

## 2011-08-14 ENCOUNTER — Telehealth: Payer: Self-pay | Admitting: Pulmonary Disease

## 2011-08-14 NOTE — Telephone Encounter (Signed)
PSG did not show obstructive sleep apnea  - no intervention required Rules of sleep hygiene advised - light exercise -avoid caffeinated beverages - no more than 20 mins staying awake in bed, if not asleep, get out of bed & reading or light music - No TV or computer games at bedtime.

## 2011-08-20 NOTE — Telephone Encounter (Signed)
Spoke with pt and notified of results/recs per RA. Pt verbalized understanding and denied any questions.

## 2011-08-20 NOTE — Telephone Encounter (Signed)
Left message with female for pt to return call

## 2011-08-22 ENCOUNTER — Ambulatory Visit: Payer: 59 | Admitting: Pulmonary Disease

## 2012-02-28 ENCOUNTER — Encounter: Payer: 59 | Admitting: Internal Medicine

## 2012-03-17 ENCOUNTER — Encounter: Payer: Self-pay | Admitting: Internal Medicine

## 2012-03-17 ENCOUNTER — Ambulatory Visit (INDEPENDENT_AMBULATORY_CARE_PROVIDER_SITE_OTHER): Payer: 59 | Admitting: Internal Medicine

## 2012-03-17 VITALS — BP 132/86 | HR 74 | Temp 98.1°F | Ht 60.0 in | Wt 161.0 lb

## 2012-03-17 DIAGNOSIS — E785 Hyperlipidemia, unspecified: Secondary | ICD-10-CM

## 2012-03-17 DIAGNOSIS — Z0000 Encounter for general adult medical examination without abnormal findings: Secondary | ICD-10-CM | POA: Insufficient documentation

## 2012-03-17 DIAGNOSIS — M810 Age-related osteoporosis without current pathological fracture: Secondary | ICD-10-CM

## 2012-03-17 DIAGNOSIS — K219 Gastro-esophageal reflux disease without esophagitis: Secondary | ICD-10-CM

## 2012-03-17 LAB — CBC WITH DIFFERENTIAL/PLATELET
Basophils Relative: 0.4 % (ref 0.0–3.0)
Eosinophils Relative: 1.9 % (ref 0.0–5.0)
Hemoglobin: 14.1 g/dL (ref 12.0–15.0)
Lymphocytes Relative: 26.2 % (ref 12.0–46.0)
MCHC: 33.1 g/dL (ref 30.0–36.0)
MCV: 86.1 fl (ref 78.0–100.0)
Monocytes Absolute: 0.7 10*3/uL (ref 0.1–1.0)
Neutro Abs: 6.1 10*3/uL (ref 1.4–7.7)
Neutrophils Relative %: 64.1 % (ref 43.0–77.0)
RBC: 4.97 Mil/uL (ref 3.87–5.11)
WBC: 9.6 10*3/uL (ref 4.5–10.5)

## 2012-03-17 LAB — HEPATIC FUNCTION PANEL
ALT: 17 U/L (ref 0–35)
AST: 21 U/L (ref 0–37)
Bilirubin, Direct: 0 mg/dL (ref 0.0–0.3)
Total Bilirubin: 0.5 mg/dL (ref 0.3–1.2)
Total Protein: 7.5 g/dL (ref 6.0–8.3)

## 2012-03-17 LAB — LIPID PANEL
Cholesterol: 225 mg/dL — ABNORMAL HIGH (ref 0–200)
Total CHOL/HDL Ratio: 4
VLDL: 35.6 mg/dL (ref 0.0–40.0)

## 2012-03-17 LAB — BASIC METABOLIC PANEL
CO2: 27 mEq/L (ref 19–32)
Chloride: 106 mEq/L (ref 96–112)
Creatinine, Ser: 0.7 mg/dL (ref 0.4–1.2)
Sodium: 143 mEq/L (ref 135–145)

## 2012-03-17 NOTE — Assessment & Plan Note (Signed)
On calcium and vitamin D (hadn't been taking D but asked her to start this with the calcium)

## 2012-03-17 NOTE — Progress Notes (Signed)
Subjective:    Patient ID: Theresa Huber, female    DOB: 23-Mar-1951, 61 y.o.   MRN: [id number]  HPI Doing okay Has occ lightheadedness---may occur at work No clear time or situation Feels "fuzzy"---lasts within a couple of minutes No cardiac symptoms  Had sleep study No findings to suggest need for Rx Still will awake with some choking---discussed raising HOB Generally awakens okay--after coffee. No excessive daytime somnolence  Current Outpatient Prescriptions on File Prior to Visit  Medication Sig Dispense Refill  . Calcium Carbonate-Vitamin D (CALTRATE 600+D) 600-400 MG-UNIT per tablet Take 1 tablet by mouth 2 (two) times daily.        Marland Kitchen omeprazole (PRILOSEC) 20 MG capsule Take 20 mg by mouth at bedtime.          Allergies  Allergen Reactions  . Shellfish Allergy Hives and Itching  . Ibandronate Sodium     REACTION: bad chest pain  . Sulfonamide Derivatives     REACTION: hives    Past Medical History  Diagnosis Date  . GERD (gastroesophageal reflux disease)   . Osteoporosis   . IBS (irritable bowel syndrome)   . HLD (hyperlipidemia)     Past Surgical History  Procedure Date  . Cesarean section 1983  . Abdominal hysterectomy   . Knee arthroscopy 1990    LEFT  . Lipoma excision 1980    LEFT THIGH    Family History  Problem Relation Age of Onset  . Diabetes Mother   . Heart disease Mother     ? A FIB  . Cancer Mother     ? sCHWANOMMA, SKIN CANCER  . COPD Father   . Hyperlipidemia Father   . Mitral valve prolapse Sister   . Cancer Sister     MELANOMA  . Sleep apnea Mother   . Sleep apnea Father   . Emphysema Father     History   Social History  . Marital Status: Married    Spouse Name: N/A    Number of Children: 1  . Years of Education: N/A   Occupational History  .  [employer]   Social History Main Topics  . Smoking status: Former Smoker -- 2.0 packs/day for 8 years    Types: Cigarettes    Quit date: 11/18/1984  . Smokeless tobacco:  Never Used  . Alcohol Use: No  . Drug Use: No  . Sexually Active: Not on file   Other Topics Concern  . Not on file   Social History Narrative  . No narrative on file   Review of Systems  Constitutional: Negative for fatigue and unexpected weight change.       Wear seat belt Walking dog now  HENT: Negative for hearing loss, congestion, rhinorrhea, dental problem and tinnitus.        Regular with dentist  Eyes: Negative for visual disturbance.       No diplopia or unilateral vision  Respiratory: Negative for cough, chest tightness and shortness of breath.   Cardiovascular: Negative for chest pain, palpitations and leg swelling.  Gastrointestinal: Negative for nausea, vomiting, abdominal pain, constipation and blood in stool.       No heartburn  Genitourinary: Negative for dysuria, frequency and difficulty urinating.       No sex--no problem  Musculoskeletal: Positive for arthralgias. Negative for back pain and joint swelling.       Mild stiffness Dr Rosita Kea diagnosed knee arthritis  Skin: Negative for rash.       Lesion on  right calf back---small red macule with peripheral clearing seen  Neurological: Positive for dizziness. Negative for syncope, weakness, light-headedness, numbness and headaches.       Occ burning in toes  Hematological: Negative for adenopathy. Does not bruise/bleed easily.  Psychiatric/Behavioral: Negative for sleep disturbance and dysphoric mood. The patient is not nervous/anxious.        Objective:   Physical Exam  Constitutional: She is oriented to person, place, and time. She appears well-developed and well-nourished. No distress.  HENT:  Head: Normocephalic and atraumatic.  Right Ear: External ear normal.  Left Ear: External ear normal.  Mouth/Throat: Oropharynx is clear and moist. No oropharyngeal exudate.  Eyes: Conjunctivae and EOM are normal. Pupils are equal, round, and reactive to light.  Neck: Normal range of motion. Neck supple. No  thyromegaly present.  Cardiovascular: Normal rate, regular rhythm, normal heart sounds and intact distal pulses.  Exam reveals no gallop.   No murmur heard. Pulmonary/Chest: Effort normal and breath sounds normal. No respiratory distress. She has no wheezes. She has no rales.  Abdominal: Soft. There is no tenderness.  Genitourinary:       No breast masses or discharge  Musculoskeletal: She exhibits no edema and no tenderness.  Lymphadenopathy:    She has no cervical adenopathy.    She has no axillary adenopathy.  Neurological: She is alert and oriented to person, place, and time.  Skin: No rash noted. No erythema.       Small red macule on right calf  Psychiatric: She has a normal mood and affect. Her behavior is normal.          Assessment & Plan:

## 2012-03-17 NOTE — Assessment & Plan Note (Signed)
controlled on the med

## 2012-03-17 NOTE — Assessment & Plan Note (Signed)
Healthy but out of shape Discussed fitness mammo next year (will do every 2 years)

## 2012-03-17 NOTE — Assessment & Plan Note (Signed)
Will recheck  No meds for primary prevention after discussion Discussed fitness

## 2012-03-18 ENCOUNTER — Encounter: Payer: Self-pay | Admitting: *Deleted

## 2012-03-24 IMAGING — MG MM CAD SCREENING MAMMO
1 series · 4 of 4 positions shown · non-contrast
Comparison: none

REASON FOR EXAM: scr
COMMENTS:

[R CC · right · 4 of 4 slices shown]
[im 1/4]
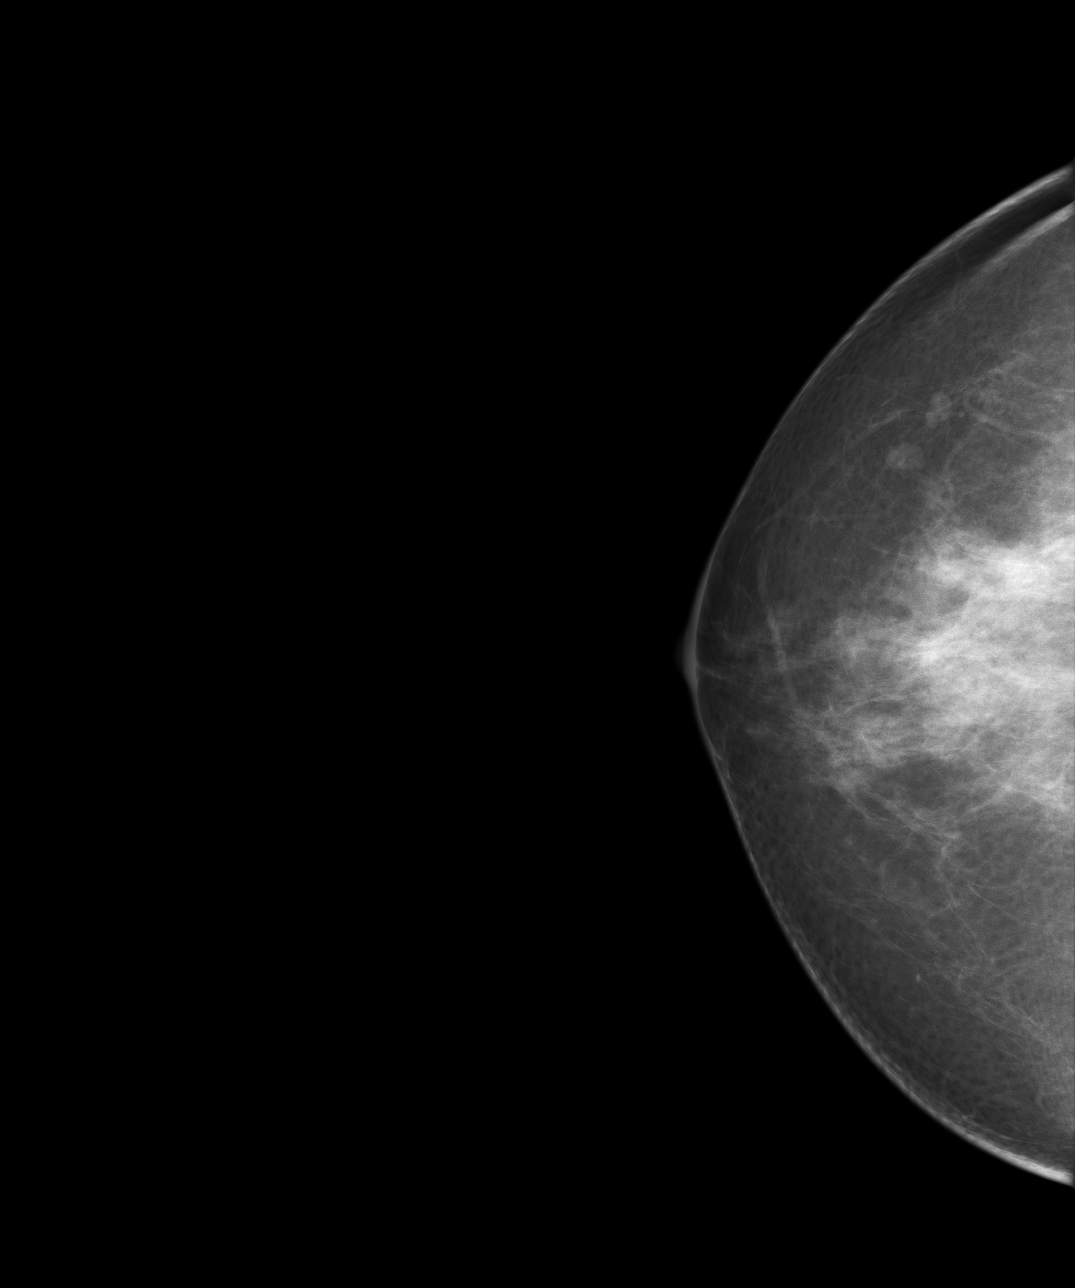
[im 2/4]
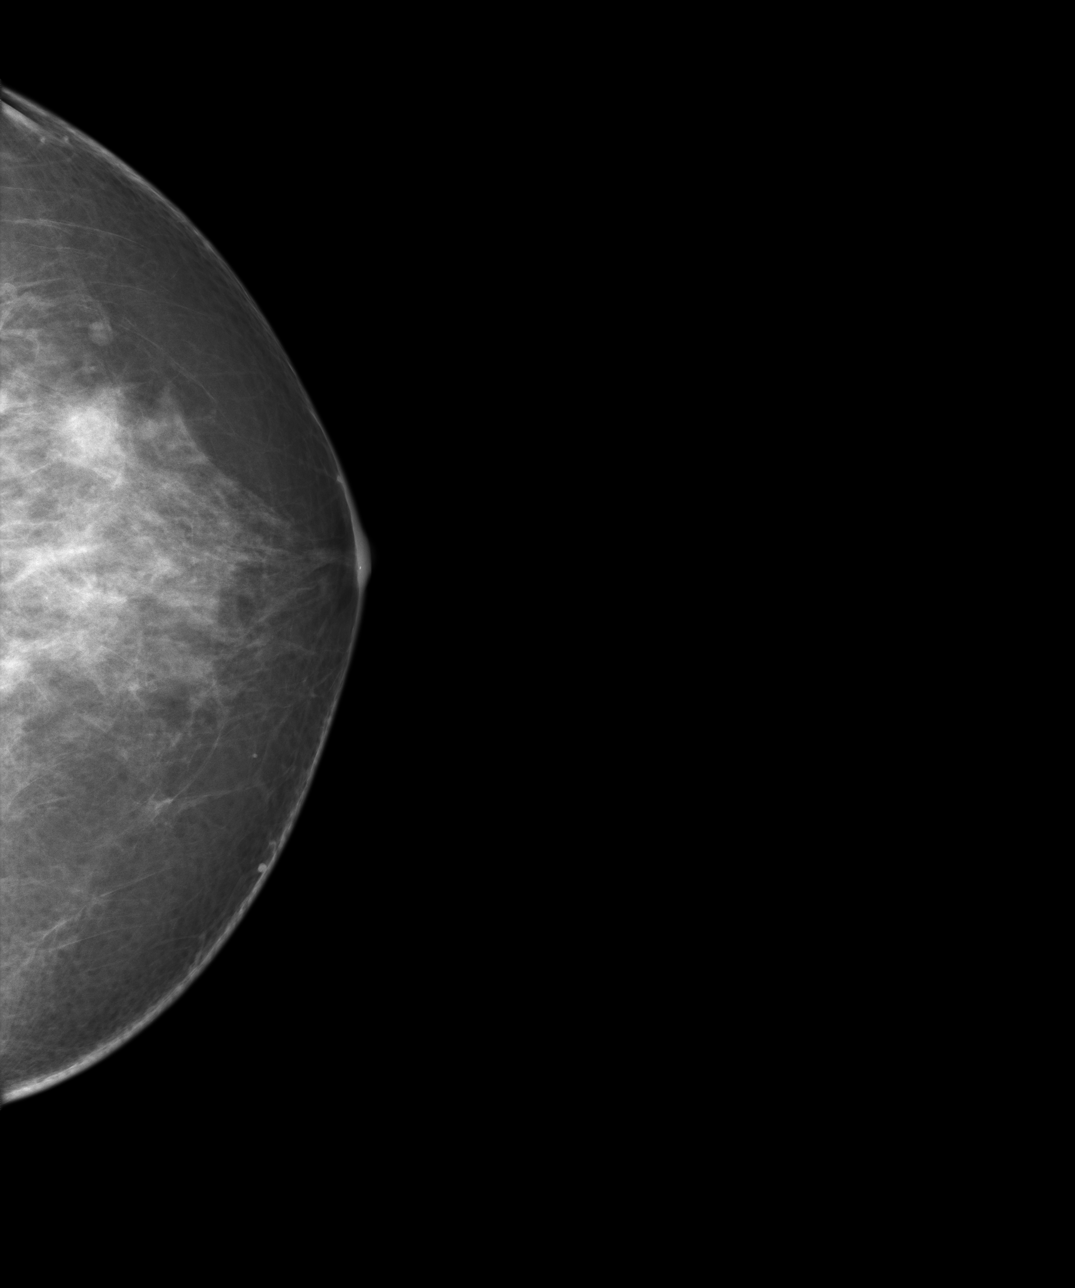
[im 3/4]
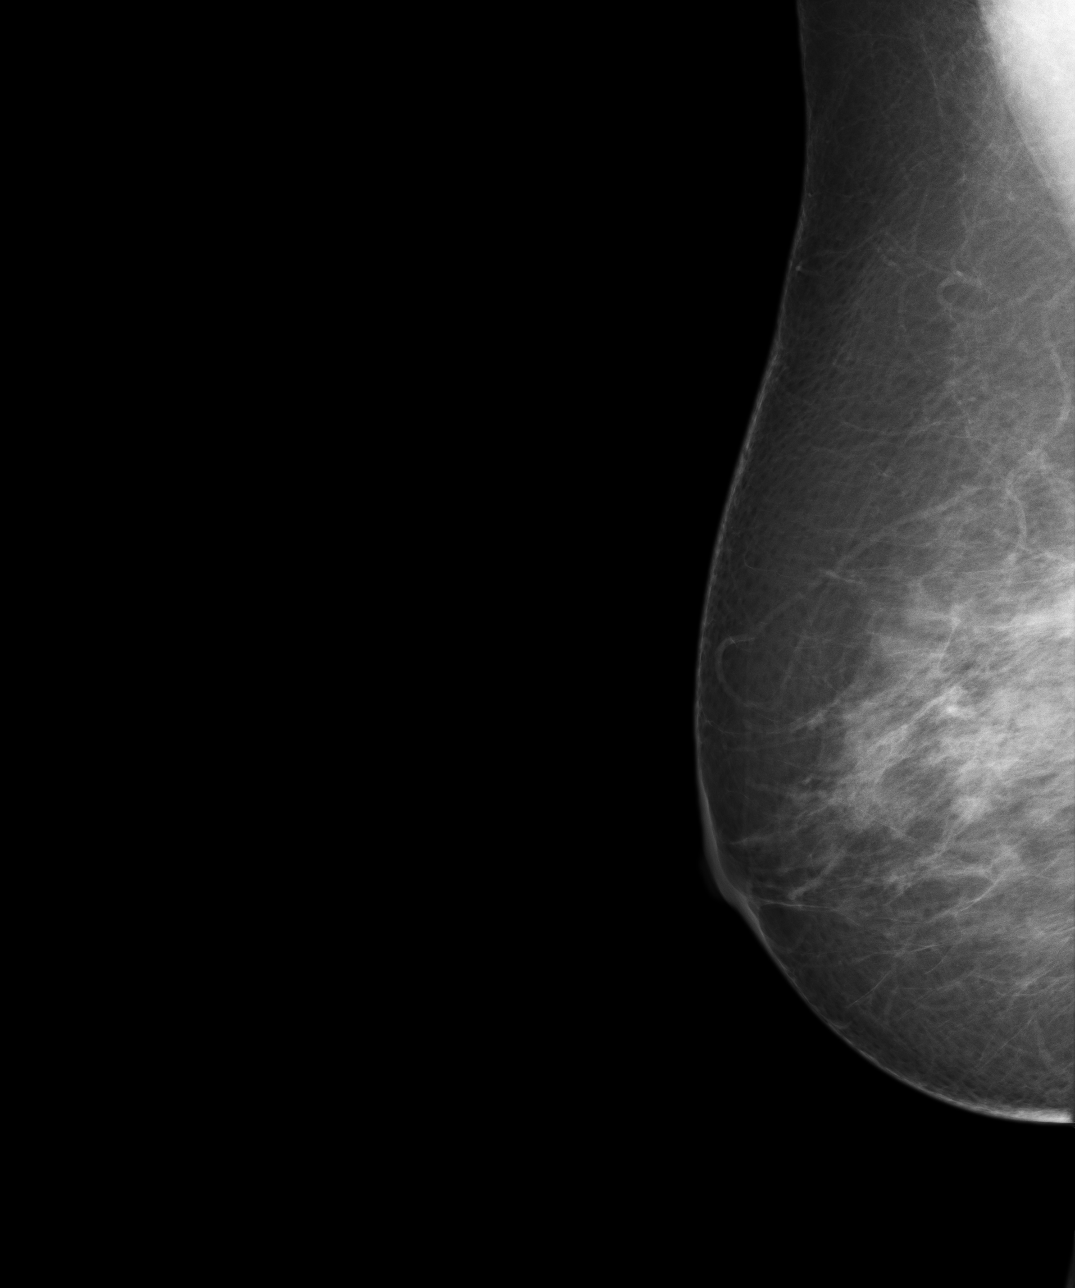
[im 4/4]
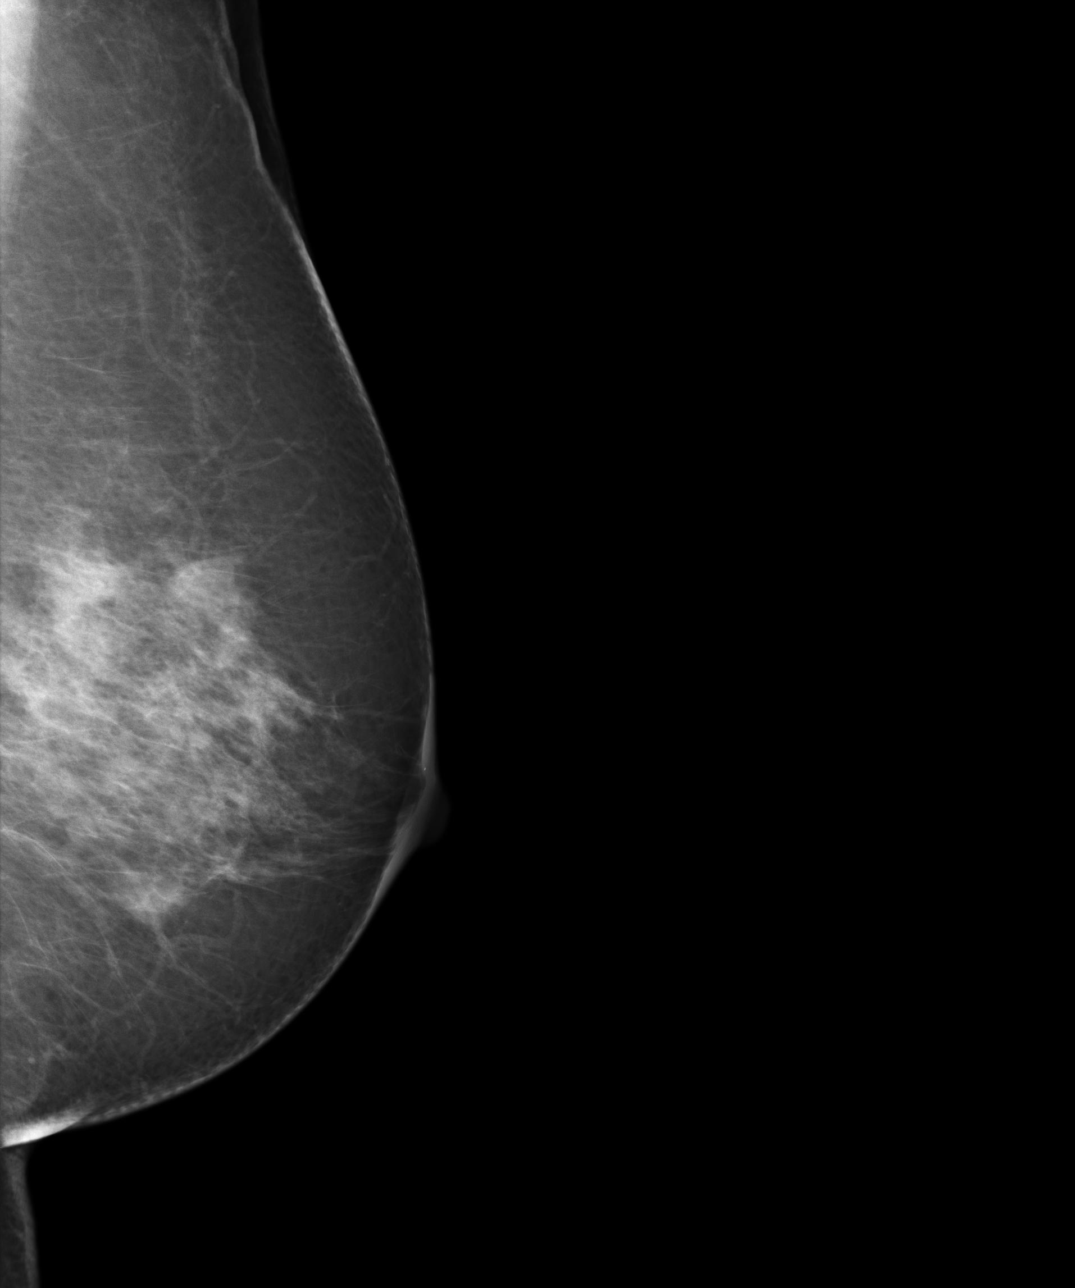

[4 of 4 positions shown; findings below may reference images not displayed]

PROCEDURE:     MAM - MAM DGTL SCREENING MAMMO W/CAD  - March 13, 2011 [DATE]

RESULT:     Comparison is made to a previous digital study 04 August, 2008 and 21 May, 2007.

The breasts exhibit a moderately dense parenchymal pattern. There is no
dominant mass. There are no malignant appearing groupings of
microcalcification. No area of new architectural distortion is seen.
IMPRESSION: I do not see findings suspicious for malignancy.

[id number]

Recommendation: Please continue to encourage yearly mammographic followup.

## 2012-09-24 ENCOUNTER — Ambulatory Visit (INDEPENDENT_AMBULATORY_CARE_PROVIDER_SITE_OTHER): Payer: 59

## 2012-09-24 DIAGNOSIS — Z23 Encounter for immunization: Secondary | ICD-10-CM

## 2012-11-23 ENCOUNTER — Ambulatory Visit (INDEPENDENT_AMBULATORY_CARE_PROVIDER_SITE_OTHER): Payer: 59 | Admitting: Internal Medicine

## 2012-11-23 ENCOUNTER — Encounter: Payer: Self-pay | Admitting: Internal Medicine

## 2012-11-23 VITALS — BP 150/90 | HR 76 | Temp 97.7°F | Wt 164.0 lb

## 2012-11-23 DIAGNOSIS — R079 Chest pain, unspecified: Secondary | ICD-10-CM

## 2012-11-23 MED ORDER — PANTOPRAZOLE SODIUM 40 MG PO TBEC: 40.0000 mg | DELAYED_RELEASE_TABLET | Freq: Two times a day (BID) | ORAL | Status: DC

## 2012-11-23 NOTE — Progress Notes (Signed)
Subjective:    Patient ID: Theresa Huber, female    DOB: 22-Nov-1950, 62 y.o.   MRN: [id number]  HPI Has been getting a lot of heaviness and pain along right side of sternum---started 2 weeks ago Fairly constant---no obvious exacerbating features Can occur at rest---like in bed Not stress related---even when off work (and no real personal stressors) Definitely not exertional  Has had a lot of belching No burning sensation but did have trouble swallowing a "clump" of mashed potatoes (3-4 days ago)  No SOB--just heavy feeling No burning Does have chronic dry cough---seems some worse No change in voice No diaphoresis or nausea No water brash  No injury or new physical tasks Has been walking more with her dog  Current Outpatient Prescriptions on File Prior to Visit  Medication Sig Dispense Refill  . Calcium Carbonate-Vitamin D (CALTRATE 600+D) 600-400 MG-UNIT per tablet Take 1 tablet by mouth 2 (two) times daily.          Allergies  Allergen Reactions  . Shellfish Allergy Hives and Itching  . Ibandronate Sodium     REACTION: bad chest pain  . Sulfonamide Derivatives     REACTION: hives    Past Medical History  Diagnosis Date  . GERD (gastroesophageal reflux disease)   . Osteoporosis   . IBS (irritable bowel syndrome)   . HLD (hyperlipidemia)     Past Surgical History  Procedure Date  . Cesarean section 1983  . Abdominal hysterectomy   . Knee arthroscopy 1990    LEFT  . Lipoma excision 1980    LEFT THIGH    Family History  Problem Relation Age of Onset  . Diabetes Mother   . Heart disease Mother     ? A FIB  . Cancer Mother     ? sCHWANOMMA, SKIN CANCER  . Sleep apnea Mother   . Dementia Mother 60    Altzheimer's  . COPD Father   . Hyperlipidemia Father   . Sleep apnea Father   . Emphysema Father   . Mitral valve prolapse Sister   . Cancer Sister     MELANOMA    History   Social History  . Marital Status: Married    Spouse Name: N/A   Number of Children: 1  . Years of Education: N/A   Occupational History  .  [employer]   Social History Main Topics  . Smoking status: Former Smoker -- 2.0 packs/day for 8 years    Types: Cigarettes    Quit date: 11/18/1984  . Smokeless tobacco: Never Used  . Alcohol Use: No  . Drug Use: No  . Sexually Active: Not on file   Other Topics Concern  . Not on file   Social History Narrative  . No narrative on file   Review of Systems Appetite is fine Bowels are regular    Objective:   Physical Exam  Constitutional: She appears well-developed and well-nourished. No distress.  Neck: Normal range of motion. Neck supple. No thyromegaly present.  Cardiovascular: Normal rate, regular rhythm, normal heart sounds and intact distal pulses.  Exam reveals no gallop.   No murmur heard. Pulmonary/Chest: Effort normal and breath sounds normal. No respiratory distress. She has no wheezes. She has no rales. She exhibits no tenderness.       No sternal or rib tenderness  Abdominal: Soft. She exhibits no distension and no mass. There is no tenderness. There is no rebound and no guarding.  Musculoskeletal: She exhibits no edema and  no tenderness.  Lymphadenopathy:    She has no cervical adenopathy.  Psychiatric: She has a normal mood and affect. Her behavior is normal.          Assessment & Plan:

## 2012-11-23 NOTE — Patient Instructions (Signed)
Please take the pantoprazole twice a day on an empty stomach. Try to increase your physical activity and note if this changes the pain at all

## 2012-11-23 NOTE — Assessment & Plan Note (Signed)
Doesn't seem to be cardiac ischemia pain---atypical EKG is normal Not exertional, has had swallowing symptoms----likely esophageal  Will start PPI Has been on prilosec so will try protonix bid Early follow up and referral if persistent symptoms

## 2012-12-17 ENCOUNTER — Encounter: Payer: Self-pay | Admitting: Family Medicine

## 2012-12-17 ENCOUNTER — Ambulatory Visit (INDEPENDENT_AMBULATORY_CARE_PROVIDER_SITE_OTHER): Payer: 59 | Admitting: Family Medicine

## 2012-12-17 VITALS — BP 128/80 | HR 90 | Temp 98.1°F | Wt 166.0 lb

## 2012-12-17 DIAGNOSIS — J069 Acute upper respiratory infection, unspecified: Secondary | ICD-10-CM

## 2012-12-17 MED ORDER — BENZONATATE 200 MG PO CAPS: 200.0000 mg | ORAL_CAPSULE | Freq: Three times a day (TID) | ORAL | Status: AC | PRN

## 2012-12-17 MED ORDER — HYDROCODONE-HOMATROPINE 5-1.5 MG/5ML PO SYRP: 5.0000 mL | ORAL_SOLUTION | Freq: Three times a day (TID) | ORAL | Status: DC | PRN

## 2012-12-17 NOTE — Patient Instructions (Addendum)
Drink plenty of fluids, take tylenol as needed, and gargle with warm salt water for your throat.  Use nasal saline frequently.  Tessalon for cough; if not improved then use the hycodan cough syrup.  This should gradually improve.  Take care.  Let us know if you have other concerns.

## 2012-12-17 NOTE — Assessment & Plan Note (Signed)
Likely viral, nontoxic.  Use salt water for throat, then tessalon, then hycodan if needed for cough.  Out of work in meantime.  She looks too well to have flu and would likely not benefit from tamiflu or testing.  She agrees with plan, f/u prn.

## 2012-12-17 NOTE — Progress Notes (Signed)
2 days of sx.  First noted tickle in in throat and then increasing cough.  Last night vomited, was nauseated likely from drainage.  No fevers.  No aches.  No facial pain.  She thought her glands were swollen in her neck.  No ear pain her ears felt "ticklish".  No anterior rhinorrhea.  Works with the public.    Meds, vitals, and allergies reviewed.   ROS: See HPI.  Otherwise, noncontributory.  GEN: nad, alert and oriented HEENT: mucous membranes moist, tm w/o erythema, nasal exam w/o erythema, clear discharge noted,  OP with cobblestoning, sinuses not ttp NECK: supple w/o LA CV: rrr.   PULM: ctab, no inc wob EXT: no edema SKIN: no acute rash

## 2013-04-01 ENCOUNTER — Ambulatory Visit (INDEPENDENT_AMBULATORY_CARE_PROVIDER_SITE_OTHER): Payer: 59 | Admitting: Internal Medicine

## 2013-04-01 ENCOUNTER — Encounter: Payer: Self-pay | Admitting: Internal Medicine

## 2013-04-01 VITALS — BP 114/76 | HR 93 | Temp 98.6°F | Wt 163.5 lb

## 2013-04-01 DIAGNOSIS — L255 Unspecified contact dermatitis due to plants, except food: Secondary | ICD-10-CM

## 2013-04-01 MED ORDER — TRIAMCINOLONE ACETONIDE 0.1 % EX CREA: TOPICAL_CREAM | Freq: Two times a day (BID) | CUTANEOUS | Status: DC | PRN

## 2013-04-01 NOTE — Assessment & Plan Note (Signed)
Not too severe Will try TAC 0.1% cream If worsens, will send Rx for prednisone

## 2013-04-01 NOTE — Progress Notes (Signed)
  Subjective:    Patient ID: Theresa Huber, female    DOB: 1951-04-04, 62 y.o.   MRN: [id number]  HPI Chest pain went away  Using the pantoprazole bid and it is working but expensive I told her to check prices at other pharmacies  Exposed to poison ivy 3-4 days ago Started with rash on face Nose burns Now scattered on arms and upper legs  Using benedryl, OTC hydrocortisone, caladryl  Current Outpatient Prescriptions on File Prior to Visit  Medication Sig Dispense Refill  . Calcium Carbonate-Vitamin D (CALTRATE 600+D) 600-400 MG-UNIT per tablet Take 1 tablet by mouth 2 (two) times daily.        . pantoprazole (PROTONIX) 40 MG tablet Take 1 tablet (40 mg total) by mouth 2 (two) times daily.  60 tablet  3  . HYDROcodone-homatropine (HYCODAN) 5-1.5 MG/5ML syrup Take 5 mLs by mouth every 8 (eight) hours as needed for cough (sedation caution).  120 mL  0   No current facility-administered medications on file prior to visit.    Allergies  Allergen Reactions  . Shellfish Allergy Hives and Itching  . Ibandronate Sodium     REACTION: bad chest pain  . Sulfonamide Derivatives     REACTION: hives    Past Medical History  Diagnosis Date  . GERD (gastroesophageal reflux disease)   . Osteoporosis   . IBS (irritable bowel syndrome)   . HLD (hyperlipidemia)     Past Surgical History  Procedure Laterality Date  . Cesarean section  1983  . Abdominal hysterectomy    . Knee arthroscopy  1990    LEFT  . Lipoma excision  1980    LEFT THIGH    Family History  Problem Relation Age of Onset  . Diabetes Mother   . Heart disease Mother     ? A FIB  . Cancer Mother     ? sCHWANOMMA, SKIN CANCER  . Sleep apnea Mother   . Dementia Mother 41    Altzheimer's  . COPD Father   . Hyperlipidemia Father   . Sleep apnea Father   . Emphysema Father   . Mitral valve prolapse Sister   . Cancer Sister     MELANOMA    History   Social History  . Marital Status: Married    Spouse  Name: N/A    Number of Children: 1  . Years of Education: N/A   Occupational History  .  [employer]   Social History Main Topics  . Smoking status: Former Smoker -- 2.00 packs/day for 8 years    Types: Cigarettes    Quit date: 11/18/1984  . Smokeless tobacco: Never Used  . Alcohol Use: No  . Drug Use: No  . Sexually Active: Not on file   Other Topics Concern  . Not on file   Social History Narrative  . No narrative on file   Review of Systems No fever Some brief nausea No cough or wheezing     Objective:   Physical Exam  Constitutional: She appears well-developed and well-nourished. No distress.  Skin:  Scattered papulovesicular rash on left cheek and under nose (and on underside of nose) Also diffuse rash on arms          Assessment & Plan:

## 2013-04-02 ENCOUNTER — Telehealth: Payer: Self-pay

## 2013-04-02 MED ORDER — PREDNISONE 20 MG PO TABS: 40.0000 mg | ORAL_TABLET | Freq: Every day | ORAL | Status: DC

## 2013-04-02 NOTE — Telephone Encounter (Signed)
Patient advised.

## 2013-04-02 NOTE — Telephone Encounter (Signed)
Pt seen 04/01/13 and this morning rash is spreading on neck and left arm; cream is not helping itching. Pt request med sent to [facility].Please advise.

## 2013-04-02 NOTE — Telephone Encounter (Signed)
Please let her know that the prescription has been sent

## 2013-07-30 ENCOUNTER — Ambulatory Visit (INDEPENDENT_AMBULATORY_CARE_PROVIDER_SITE_OTHER): Payer: 59 | Admitting: Internal Medicine

## 2013-07-30 ENCOUNTER — Encounter: Payer: Self-pay | Admitting: Internal Medicine

## 2013-07-30 VITALS — BP 120/80 | HR 86 | Temp 98.5°F | Ht 60.0 in | Wt 167.0 lb

## 2013-07-30 DIAGNOSIS — K219 Gastro-esophageal reflux disease without esophagitis: Secondary | ICD-10-CM

## 2013-07-30 DIAGNOSIS — E785 Hyperlipidemia, unspecified: Secondary | ICD-10-CM

## 2013-07-30 DIAGNOSIS — Z23 Encounter for immunization: Secondary | ICD-10-CM

## 2013-07-30 DIAGNOSIS — M810 Age-related osteoporosis without current pathological fracture: Secondary | ICD-10-CM

## 2013-07-30 DIAGNOSIS — Z0000 Encounter for general adult medical examination without abnormal findings: Secondary | ICD-10-CM

## 2013-07-30 DIAGNOSIS — Z780 Asymptomatic menopausal state: Secondary | ICD-10-CM

## 2013-07-30 LAB — CBC WITH DIFFERENTIAL/PLATELET
Basophils Relative: 0 % (ref 0–1)
HCT: 42 % (ref 36.0–46.0)
Hemoglobin: 13.7 g/dL (ref 12.0–15.0)
Lymphocytes Relative: 24 % (ref 12–46)
MCHC: 32.6 g/dL (ref 30.0–36.0)
MCV: 86.2 fL (ref 78.0–100.0)
Monocytes Absolute: 0.9 10*3/uL (ref 0.1–1.0)
Monocytes Relative: 10 % (ref 3–12)
Neutro Abs: 5.9 10*3/uL (ref 1.7–7.7)

## 2013-07-30 NOTE — Assessment & Plan Note (Signed)
Borderline some years ago Will recheck DEXA

## 2013-07-30 NOTE — Addendum Note (Signed)
Addended by: Sueanne Margarita on: 07/30/2013 02:47 PM   Modules accepted: Orders

## 2013-07-30 NOTE — Progress Notes (Signed)
Subjective:    Patient ID: Theresa Huber, female    DOB: 05/07/1951, 62 y.o.   MRN: [id number]  HPI Here for physical No new problems No changes in job, family, etc Multiple ticks this year--no obvious problems with them  Reviewed osteoporosis DEXA 2008  Ongoing knee arthritis  Uses aleve and that helps Walks the dogs/on her feet all day  Reflux is controlled on the med  Current Outpatient Prescriptions on File Prior to Visit  Medication Sig Dispense Refill  . Calcium Carbonate-Vitamin D (CALTRATE 600+D) 600-400 MG-UNIT per tablet Take 1 tablet by mouth 2 (two) times daily.        . pantoprazole (PROTONIX) 40 MG tablet Take 1 tablet (40 mg total) by mouth 2 (two) times daily.  60 tablet  3   No current facility-administered medications on file prior to visit.    Allergies  Allergen Reactions  . Shellfish Allergy Hives and Itching  . Ibandronate Sodium     REACTION: bad chest pain  . Sulfonamide Derivatives     REACTION: hives    Past Medical History  Diagnosis Date  . GERD (gastroesophageal reflux disease)   . IBS (irritable bowel syndrome)   . HLD (hyperlipidemia)   . Osteoporosis     T score -2.5 at hip in 2008    Past Surgical History  Procedure Laterality Date  . Cesarean section  1983  . Abdominal hysterectomy    . Knee arthroscopy  1990    LEFT  . Lipoma excision  1980    LEFT THIGH    Family History  Problem Relation Age of Onset  . Diabetes Mother   . Heart disease Mother     ? A FIB  . Cancer Mother     ? sCHWANOMMA, SKIN CANCER  . Sleep apnea Mother   . Dementia Mother 24    Altzheimer's  . COPD Father   . Hyperlipidemia Father   . Sleep apnea Father   . Emphysema Father   . Mitral valve prolapse Sister   . Cancer Sister     MELANOMA    History   Social History  . Marital Status: Married    Spouse Name: N/A    Number of Children: 1  . Years of Education: N/A   Occupational History  .  [employer]   Social History Main  Topics  . Smoking status: Former Smoker -- 2.00 packs/day for 8 years    Types: Cigarettes    Quit date: 11/18/1984  . Smokeless tobacco: Never Used  . Alcohol Use: No  . Drug Use: No  . Sexual Activity: Not on file   Other Topics Concern  . Not on file   Social History Narrative  . No narrative on file   Review of Systems  Constitutional: Negative for fatigue and unexpected weight change.       Wears seat belt  HENT: Positive for tinnitus. Negative for hearing loss, congestion, rhinorrhea and dental problem.        Rare tinnitus Regular with dentist  Eyes: Negative for visual disturbance.       No diplopia or unilateral vision loss Early cataracts  Respiratory: Positive for cough. Negative for chest tightness and shortness of breath.        Occ dry cough  Cardiovascular: Positive for palpitations and leg swelling. Negative for chest pain.       Palpitations once--?cold med Some feet swelling in evening after work  Gastrointestinal: Negative for nausea, vomiting,  constipation and blood in stool.       Occasional rectal pressure Heartburn controlled on bedtime PPI  Endocrine: Positive for heat intolerance. Negative for cold intolerance.       A;lways hot since the change  Genitourinary: Positive for urgency. Negative for dysuria and difficulty urinating.  Musculoskeletal: Positive for arthralgias. Negative for back pain and joint swelling.       Knees hurt  Skin: Negative for rash.       No suspicious lesions   Allergic/Immunologic: Negative for environmental allergies and immunocompromised state.  Neurological: Positive for headaches. Negative for dizziness, syncope, weakness, light-headedness and numbness.       Occasional migraines-- no meds for this Rare burning feeling in toes  Hematological: Negative for adenopathy. Bruises/bleeds easily.  Psychiatric/Behavioral: Positive for sleep disturbance. Negative for dysphoric mood. The patient is not nervous/anxious.         Dog keeps her up       Objective:   Physical Exam  Constitutional: She is oriented to person, place, and time. She appears well-developed and well-nourished. No distress.  HENT:  Head: Normocephalic and atraumatic.  Right Ear: External ear normal.  Left Ear: External ear normal.  Mouth/Throat: Oropharynx is clear and moist. No oropharyngeal exudate.  Eyes: Conjunctivae and EOM are normal. Pupils are equal, round, and reactive to light.  Neck: Normal range of motion. Neck supple. No thyromegaly present.  Cardiovascular: Normal rate, regular rhythm, normal heart sounds and intact distal pulses.  Exam reveals no gallop.   No murmur heard. Pulmonary/Chest: Effort normal and breath sounds normal. No respiratory distress. She has no wheezes. She has no rales.  Abdominal: Soft. There is no tenderness.  Genitourinary:  No breast masses or tenderness  Musculoskeletal: She exhibits no edema and no tenderness.  Lymphadenopathy:    She has no cervical adenopathy.    She has no axillary adenopathy.  Neurological: She is alert and oriented to person, place, and time.  Skin: No rash noted. No erythema.  Psychiatric: She has a normal mood and affect. Her behavior is normal.          Assessment & Plan:

## 2013-07-30 NOTE — Assessment & Plan Note (Signed)
Discussed primary prevention No statin for now 

## 2013-07-30 NOTE — Assessment & Plan Note (Signed)
Controlled with once daily PPI

## 2013-07-30 NOTE — Patient Instructions (Signed)
Please set up your screening mammogram if you decide to get it.

## 2013-07-30 NOTE — Assessment & Plan Note (Signed)
Healthy but needs to work on fitness Not sure if she wants Val[facility] Party will consider

## 2013-07-31 LAB — HEPATIC FUNCTION PANEL
AST: 16 U/L (ref 0–37)
Alkaline Phosphatase: 121 U/L — ABNORMAL HIGH (ref 39–117)
Bilirubin, Direct: 0.1 mg/dL (ref 0.0–0.3)
Total Bilirubin: 0.4 mg/dL (ref 0.3–1.2)

## 2013-07-31 LAB — BASIC METABOLIC PANEL
CO2: 29 mEq/L (ref 19–32)
Calcium: 9.4 mg/dL (ref 8.4–10.5)
Potassium: 4.3 mEq/L (ref 3.5–5.3)
Sodium: 145 mEq/L (ref 135–145)

## 2013-07-31 LAB — LIPID PANEL
Cholesterol: 252 mg/dL — ABNORMAL HIGH (ref 0–200)
HDL: 52 mg/dL (ref >39)
LDL Cholesterol: 149 mg/dL — ABNORMAL HIGH (ref 0–99)

## 2013-08-09 ENCOUNTER — Encounter: Payer: Self-pay | Admitting: Internal Medicine

## 2013-08-31 ENCOUNTER — Ambulatory Visit: Payer: Self-pay | Admitting: Internal Medicine

## 2013-08-31 ENCOUNTER — Encounter: Payer: Self-pay | Admitting: Internal Medicine

## 2013-09-08 ENCOUNTER — Ambulatory Visit: Payer: Self-pay | Admitting: Internal Medicine

## 2013-09-08 ENCOUNTER — Encounter: Payer: Self-pay | Admitting: Internal Medicine

## 2013-09-08 ENCOUNTER — Telehealth: Payer: Self-pay

## 2013-09-08 NOTE — Telephone Encounter (Signed)
Pt left v/m requesting bone density results.left pt detailed v/m. Your bone density looks the same as 2008.  This is reassuring and no new medications are needed now.  We can discuss this again at your next visit            Exam Information

## 2013-10-19 ENCOUNTER — Telehealth: Payer: Self-pay

## 2013-10-19 NOTE — Telephone Encounter (Signed)
Pt request 07/2013 lab results; pt notified as instructed from result note in mychart; pt said she would go in my chart also. Pt request chol 252, HDL 52 and glucose 104 for wellness form for ins. Info given verbally to pt.

## 2013-11-19 ENCOUNTER — Encounter: Payer: Self-pay | Admitting: Family Medicine

## 2013-11-19 ENCOUNTER — Ambulatory Visit (INDEPENDENT_AMBULATORY_CARE_PROVIDER_SITE_OTHER): Payer: 59 | Admitting: Family Medicine

## 2013-11-19 ENCOUNTER — Encounter: Payer: Self-pay | Admitting: *Deleted

## 2013-11-19 VITALS — BP 124/78 | HR 106 | Temp 99.2°F | Wt 169.8 lb

## 2013-11-19 DIAGNOSIS — R6889 Other general symptoms and signs: Secondary | ICD-10-CM

## 2013-11-19 DIAGNOSIS — J069 Acute upper respiratory infection, unspecified: Secondary | ICD-10-CM

## 2013-11-19 DIAGNOSIS — B9789 Other viral agents as the cause of diseases classified elsewhere: Secondary | ICD-10-CM

## 2013-11-19 LAB — POCT INFLUENZA A/B
INFLUENZA A, POC: NEGATIVE
INFLUENZA B, POC: NEGATIVE

## 2013-11-19 MED ORDER — GUAIFENESIN-CODEINE 100-10 MG/5ML PO SYRP: 5.0000 mL | ORAL_SOLUTION | Freq: Every evening | ORAL | Status: DC | PRN

## 2013-11-19 MED ORDER — PANTOPRAZOLE SODIUM 40 MG PO TBEC: 40.0000 mg | DELAYED_RELEASE_TABLET | Freq: Two times a day (BID) | ORAL | Status: DC

## 2013-11-19 NOTE — Assessment & Plan Note (Signed)
Neg flu test. Treat symptomatically with cough suppressant and mucinex.  Call if not improving as expected.

## 2013-11-19 NOTE — Patient Instructions (Addendum)
Mucinex DM during the day. Use cough suppressant at night as needed. Expect cough and congestion to worsen some in next 2-3 days, then start improving some at day 5-7 of illness. Symptoms may last 10-14 days.  Call if Shortness of breath, or development of fever late in illness.  Go to ER if severe shortness of breath.

## 2013-11-19 NOTE — Progress Notes (Signed)
   Subjective:    Patient ID: Theresa Huber, female    DOB: 24-Feb-1951, 63 y.o.   MRN: [id number]  URI  This is a new problem. The current episode started in the past 7 days (2 days ago, sudden onset). Maximum temperature: low grade 99.9 F. Associated symptoms include congestion, coughing, ear pain, nausea, sinus pain and vomiting. Pertinent negatives include no abdominal pain, chest pain, dysuria, joint pain, joint swelling, rhinorrhea, sneezing, sore throat, swollen glands or wheezing. Associated symptoms comments: Myalgia  productive  Cough  fullness in ear on right, Frontal tenderness, headache. Treatments tried: alkaseltzer plus. The treatment provided moderate relief.      Review of Systems  Constitutional: Negative for fever and fatigue.  HENT: Positive for congestion and ear pain. Negative for rhinorrhea, sneezing and sore throat.   Eyes: Negative for pain.  Respiratory: Positive for cough. Negative for chest tightness, shortness of breath and wheezing.   Cardiovascular: Negative for chest pain, palpitations and leg swelling.  Gastrointestinal: Positive for nausea and vomiting. Negative for abdominal pain.  Genitourinary: Negative for dysuria.  Musculoskeletal: Negative for joint pain.       Objective:   Physical Exam  Constitutional: Vital signs are normal. She appears well-developed and well-nourished. She is cooperative.  Non-toxic appearance. She does not appear ill. No distress.  Fatigued appearing in NAD  HENT:  Head: Normocephalic.  Right Ear: Hearing, tympanic membrane, external ear and ear canal normal. Tympanic membrane is not erythematous, not retracted and not bulging.  Left Ear: Hearing, tympanic membrane, external ear and ear canal normal. Tympanic membrane is not erythematous, not retracted and not bulging.  Nose: Mucosal edema and rhinorrhea present. Right sinus exhibits no maxillary sinus tenderness and no frontal sinus tenderness. Left sinus exhibits no  maxillary sinus tenderness and no frontal sinus tenderness.  Mouth/Throat: Uvula is midline, oropharynx is clear and moist and mucous membranes are normal.  Eyes: Conjunctivae, EOM and lids are normal. Pupils are equal, round, and reactive to light. Lids are everted and swept, no foreign bodies found.  Neck: Trachea normal and normal range of motion. Neck supple. Carotid bruit is not present. No mass and no thyromegaly present.  Cardiovascular: Normal rate, regular rhythm, S1 normal, S2 normal, normal heart sounds, intact distal pulses and normal pulses.  Exam reveals no gallop and no friction rub.   No murmur heard. Pulmonary/Chest: Effort normal and breath sounds normal. Not tachypneic. No respiratory distress. She has no decreased breath sounds. She has no wheezes. She has no rhonchi. She has no rales.  Neurological: She is alert.  Skin: Skin is warm, dry and intact. No rash noted.  Psychiatric: Her speech is normal and behavior is normal. Judgment normal. Her mood appears not anxious. Cognition and Theresa are normal. She does not exhibit a depressed mood.          Assessment & Plan:

## 2013-11-19 NOTE — Progress Notes (Signed)
Pre-visit discussion using our clinic review tool. No additional management support is needed unless otherwise documented below in the visit note.  

## 2013-11-22 ENCOUNTER — Telehealth: Payer: Self-pay

## 2013-11-22 MED ORDER — AMOXICILLIN 500 MG PO TABS: 1000.0000 mg | ORAL_TABLET | Freq: Two times a day (BID) | ORAL | Status: DC

## 2013-11-22 NOTE — Telephone Encounter (Signed)
Pt left v/m; pt was seen 11/19/13 with neg flu test and pt taking robitussin AC but still coughing with a lot of mucus. The amount of mucus and cough has worsened and pt hurting in back due to coughing so much. Pt request med to dry up mucus.[facility]. Tried to contact pt for additional info (fever, prod or non prod cough, wheezing etc) but unable to reach pt.Please advise.

## 2013-11-22 NOTE — Telephone Encounter (Signed)
Since she is worsening I would recommend a trial with an antibiotic  Send Rx for amoxil 500mg  #40 x 0 2 tabs bid for 10 days

## 2013-11-22 NOTE — Telephone Encounter (Signed)
Spoke with patient and advised results rx sent to pharmacy by e-script  

## 2013-11-22 NOTE — Telephone Encounter (Signed)
Pt left v/m requesting cb on med request.

## 2013-11-22 NOTE — Telephone Encounter (Signed)
Please check into this I have been told to put in as "post menopausal" but basically screening

## 2013-11-22 NOTE — Telephone Encounter (Signed)
Pt left v/m; had bone density 09-18-2013 and pt thinks coded incorrectly; pt thinks should have been coded as preventative; pt request [employer] be contacted if coded incorrectly [phone].Marland KitchenPlease advise.

## 2013-11-23 ENCOUNTER — Ambulatory Visit: Payer: 59 | Admitting: Family Medicine

## 2013-12-15 NOTE — Telephone Encounter (Signed)
We did not do the bone density here so insurance was hesitant to talk to me about the bone density.  They will reprocess.

## 2014-01-17 ENCOUNTER — Other Ambulatory Visit: Payer: Self-pay | Admitting: Internal Medicine

## 2014-08-02 ENCOUNTER — Ambulatory Visit (INDEPENDENT_AMBULATORY_CARE_PROVIDER_SITE_OTHER): Payer: 59 | Admitting: Internal Medicine

## 2014-08-02 ENCOUNTER — Encounter: Payer: Self-pay | Admitting: Internal Medicine

## 2014-08-02 VITALS — BP 120/80 | HR 55 | Temp 98.1°F | Ht 61.5 in | Wt 163.0 lb

## 2014-08-02 DIAGNOSIS — K219 Gastro-esophageal reflux disease without esophagitis: Secondary | ICD-10-CM | POA: Diagnosis not present

## 2014-08-02 DIAGNOSIS — Z0000 Encounter for general adult medical examination without abnormal findings: Secondary | ICD-10-CM | POA: Diagnosis not present

## 2014-08-02 DIAGNOSIS — M810 Age-related osteoporosis without current pathological fracture: Secondary | ICD-10-CM | POA: Diagnosis not present

## 2014-08-02 DIAGNOSIS — E785 Hyperlipidemia, unspecified: Secondary | ICD-10-CM

## 2014-08-02 DIAGNOSIS — Z23 Encounter for immunization: Secondary | ICD-10-CM

## 2014-08-02 LAB — COMPREHENSIVE METABOLIC PANEL
ALK PHOS: 120 U/L — AB (ref 39–117)
ALT: 15 U/L (ref 0–35)
AST: 16 U/L (ref 0–37)
Albumin: 3.5 g/dL (ref 3.5–5.2)
BILIRUBIN TOTAL: 0.5 mg/dL (ref 0.2–1.2)
BUN: 19 mg/dL (ref 6–23)
CO2: 30 mEq/L (ref 19–32)
CREATININE: 0.8 mg/dL (ref 0.4–1.2)
Calcium: 8.9 mg/dL (ref 8.4–10.5)
Chloride: 104 mEq/L (ref 96–112)
GFR: 83.25 mL/min (ref >60.00)
Glucose, Bld: 92 mg/dL (ref 70–99)
Potassium: 4.1 mEq/L (ref 3.5–5.1)
SODIUM: 141 meq/L (ref 135–145)
TOTAL PROTEIN: 7 g/dL (ref 6.0–8.3)

## 2014-08-02 LAB — LIPID PANEL
CHOL/HDL RATIO: 5
Cholesterol: 217 mg/dL — ABNORMAL HIGH (ref 0–200)
HDL: 45.7 mg/dL (ref >39.00)
LDL Cholesterol: 134 mg/dL — ABNORMAL HIGH (ref 0–99)
NonHDL: 171.3
Triglycerides: 185 mg/dL — ABNORMAL HIGH (ref 0.0–149.0)
VLDL: 37 mg/dL (ref 0.0–40.0)

## 2014-08-02 LAB — T4, FREE: FREE T4: 0.89 ng/dL (ref 0.60–1.60)

## 2014-08-02 NOTE — Assessment & Plan Note (Signed)
Healthy but needs to work on fitness Mammogram due 2016 May need colon later this year (past polyps?) Flu shot

## 2014-08-02 NOTE — Progress Notes (Signed)
Pre visit review using our clinic review tool, if applicable. No additional management support is needed unless otherwise documented below in the visit note. 

## 2014-08-02 NOTE — Assessment & Plan Note (Signed)
Needs ongoing Rx  Will try OTC meds to decrease cost

## 2014-08-02 NOTE — Assessment & Plan Note (Signed)
Discussed primary prevention She will accept statin if LDL over 190

## 2014-08-02 NOTE — Progress Notes (Signed)
Subjective:    Patient ID: Theresa Huber, female    DOB: [birth year], 63 y.o.   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 Calcium Carbonate-Vitamin D (CALTRATE 600+D) 600-400 MG-UNIT per tablet Take 1 tablet by mouth 2 (two) times daily.         No current facility-administered medications on file prior to visit.    Allergies  Allergen Reactions  . Shellfish Allergy Hives and Itching  . Ibandronate Sodium     REACTION: bad chest pain  . Sulfonamide Derivatives     REACTION: hives    Past Medical History  Diagnosis Date  . GERD (gastroesophageal reflux disease)   . IBS (irritable bowel syndrome)   . HLD (hyperlipidemia)   . Osteoporosis     T score -2.5 at hip in 2008    Past Surgical History  Procedure Laterality Date  . Cesarean section  1983  . Abdominal hysterectomy    . Knee arthroscopy  1990    LEFT  . Lipoma excision  1980    LEFT THIGH    Family History  Problem Relation Age of Onset  . Diabetes Mother   . Heart disease Mother     ? A FIB  . Cancer Mother     ? sCHWANOMMA, SKIN CANCER  . Sleep apnea Mother   . Dementia Mother 61    Altzheimer's  . COPD Father   . Hyperlipidemia Father   . Sleep apnea Father   . Emphysema Father   . Mitral valve prolapse Sister   . Cancer Sister     MELANOMA    History   Social History  . Marital Status: Married    Spouse Name: N/A    Number of  Children: 1  . Years of Education: N/A   Occupational History  .  [employer]   Social History Main Topics  . Smoking status: Former Smoker -- 2.00 packs/day for 8 years    Types: Cigarettes    Quit date: 11/18/1984  . Smokeless tobacco: Never Used  . Alcohol Use: No  . Drug Use: No  . Sexual Activity: Not on file   Other Topics Concern  . Not on file   Social History Narrative  . No narrative on file   Review of Systems  Constitutional: Negative for fatigue and unexpected weight change.       Wear seat belt  HENT: Negative for dental problem, hearing loss, tinnitus and trouble swallowing.        Regular with dentist  Eyes: Negative for visual disturbance.       No diplopia or unilateral vision loss  Respiratory: Positive for cough. Negative for chest tightness and shortness of breath.        Cough from reflux  Cardiovascular: Negative for chest pain, palpitations and leg swelling.  Gastrointestinal: Positive for blood in stool. Negative for nausea, vomiting, abdominal pain and  constipation.       Occ red blood in stool if she has to push hard UTD on colonoscopy  Endocrine: Negative for polydipsia and polyuria.  Genitourinary: Negative for dysuria and hematuria.       No sex --no problem No incontinence  Musculoskeletal: Positive for arthralgias. Negative for back pain and joint swelling.       Gets night cramps at times  Skin:       No suspicious lesions Blotchy at times  Allergic/Immunologic: Positive for environmental allergies. Negative for immunocompromised state.       Doesn't use meds  Neurological: Positive for light-headedness. Negative for dizziness, syncope, weakness, numbness and headaches.  Hematological: Negative for adenopathy. Does not bruise/bleed easily.  Psychiatric/Behavioral: Negative for sleep disturbance and dysphoric mood. The patient is not nervous/anxious.        Objective:   Physical Exam  Constitutional: She is oriented to person, place,  and time. She appears well-developed and well-nourished. No distress.  HENT:  Head: Normocephalic and atraumatic.  Right Ear: External ear normal.  Left Ear: External ear normal.  Mouth/Throat: Oropharynx is clear and moist. No oropharyngeal exudate.  Eyes: Conjunctivae and EOM are normal. Pupils are equal, round, and reactive to light.  Neck: Normal range of motion. Neck supple. No thyromegaly present.  Cardiovascular: Normal rate, regular rhythm, normal heart sounds and intact distal pulses.  Exam reveals no gallop.   No murmur heard. Pulmonary/Chest: Effort normal and breath sounds normal. No respiratory distress. She has no wheezes. She has no rales.  Abdominal: Soft. There is no tenderness.  Genitourinary:  No breast masses or tenderness  Musculoskeletal: She exhibits no edema and no tenderness.  Lymphadenopathy:    She has no cervical adenopathy.    She has no axillary adenopathy.  Neurological: She is alert and oriented to person, place, and time.  Skin: No rash noted. No erythema.  Psychiatric: She has a normal mood and affect. Her behavior is normal.          Assessment & Plan:

## 2014-08-02 NOTE — Assessment & Plan Note (Signed)
On calcium and vitamin D Discussed daily walking and exercise Consider bisphosphonate when older (though caution with GERD)

## 2014-08-02 NOTE — Patient Instructions (Signed)
DASH Eating Plan °DASH stands for "Dietary Approaches to Stop Hypertension." The DASH eating plan is a healthy eating plan that has been shown to reduce high blood pressure (hypertension). Additional health benefits may include reducing the risk of type 2 diabetes mellitus, heart disease, and stroke. The DASH eating plan may also help with weight loss. °WHAT DO I NEED TO KNOW ABOUT THE DASH EATING PLAN? °For the DASH eating plan, you will follow these general guidelines: °· Choose foods with a percent daily value for sodium of less than 5% (as listed on the food label). °· Use salt-free seasonings or herbs instead of table salt or sea salt. °· Check with your health care provider or pharmacist before using salt substitutes. °· Eat lower-sodium products, often labeled as "lower sodium" or "no salt added." °· Eat fresh foods. °· Eat more vegetables, fruits, and low-fat dairy products. °· Choose whole grains. Look for the word "whole" as the first word in the ingredient list. °· Choose fish and skinless chicken or turkey more often than red meat. Limit fish, poultry, and meat to 6 oz (170 g) each day. °· Limit sweets, desserts, sugars, and sugary drinks. °· Choose heart-healthy fats. °· Limit cheese to 1 oz (28 g) per day. °· Eat more home-cooked food and less restaurant, buffet, and fast food. °· Limit fried foods. °· Cook foods using methods other than frying. °· Limit canned vegetables. If you do use them, rinse them well to decrease the sodium. °· When eating at a restaurant, ask that your food be prepared with less salt, or no salt if possible. °WHAT FOODS CAN I EAT? °Seek help from a dietitian for individual calorie needs. °Grains °Whole grain or whole wheat bread. Brown rice. Whole grain or whole wheat pasta. Quinoa, bulgur, and whole grain cereals. Low-sodium cereals. Corn or whole wheat flour tortillas. Whole grain cornbread. Whole grain crackers. Low-sodium crackers. °Vegetables °Fresh or frozen vegetables  (raw, steamed, roasted, or grilled). Low-sodium or reduced-sodium tomato and vegetable juices. Low-sodium or reduced-sodium tomato sauce and paste. Low-sodium or reduced-sodium canned vegetables.  °Fruits °All fresh, canned (in natural juice), or frozen fruits. °Meat and Other Protein Products °Ground beef (85% or leaner), grass-fed beef, or beef trimmed of fat. Skinless chicken or turkey. Ground chicken or turkey. Pork trimmed of fat. All fish and seafood. Eggs. Dried beans, peas, or lentils. Unsalted nuts and seeds. Unsalted canned beans. °Dairy °Low-fat dairy products, such as skim or 1% milk, 2% or reduced-fat cheeses, low-fat ricotta or cottage cheese, or plain low-fat yogurt. Low-sodium or reduced-sodium cheeses. °Fats and Oils °Tub margarines without trans fats. Light or reduced-fat mayonnaise and salad dressings (reduced sodium). Avocado. Safflower, olive, or canola oils. Natural peanut or almond butter. °Other °Unsalted popcorn and pretzels. °The items listed above may not be a complete list of recommended foods or beverages. Contact your dietitian for more options. °WHAT FOODS ARE NOT RECOMMENDED? °Grains °White bread. White pasta. White rice. Refined cornbread. Bagels and croissants. Crackers that contain trans fat. °Vegetables °Creamed or fried vegetables. Vegetables in a cheese sauce. Regular canned vegetables. Regular canned tomato sauce and paste. Regular tomato and vegetable juices. °Fruits °Dried fruits. Canned fruit in light or heavy syrup. Fruit juice. °Meat and Other Protein Products °Fatty cuts of meat. Ribs, chicken wings, bacon, sausage, bologna, salami, chitterlings, fatback, hot dogs, bratwurst, and packaged luncheon meats. Salted nuts and seeds. Canned beans with salt. °Dairy °Whole or 2% milk, cream, half-and-half, and cream cheese. Whole-fat or sweetened yogurt. Full-fat   cheeses or blue cheese. Nondairy creamers and whipped toppings. Processed cheese, cheese spreads, or cheese  curds. °Condiments °Onion and garlic salt, seasoned salt, table salt, and sea salt. Canned and packaged gravies. Worcestershire sauce. Tartar sauce. Barbecue sauce. Teriyaki sauce. Soy sauce, including reduced sodium. Steak sauce. Fish sauce. Oyster sauce. Cocktail sauce. Horseradish. Ketchup and mustard. Meat flavorings and tenderizers. Bouillon cubes. Hot sauce. Tabasco sauce. Marinades. Taco seasonings. Relishes. °Fats and Oils °Butter, stick margarine, lard, shortening, ghee, and bacon fat. Coconut, palm kernel, or palm oils. Regular salad dressings. °Other °Pickles and olives. Salted popcorn and pretzels. °The items listed above may not be a complete list of foods and beverages to avoid. Contact your dietitian for more information. °WHERE CAN I FIND MORE INFORMATION? °National Heart, Lung, and Blood Institute: www.nhlbi.nih.gov/health/health-topics/topics/dash/ °Document Released: 10/24/2011 Document Revised: 03/21/2014 Document Reviewed: 09/08/2013 °ExitCare® Patient Information ©2015 [employer]. This information is not intended to replace advice given to you by your health care provider. Make sure you discuss any questions you have with your health care provider. ° °

## 2014-08-02 NOTE — Addendum Note (Signed)
Addended by: Despina Hidden on: 08/02/2014 12:49 PM   Modules accepted: Orders

## 2014-08-03 LAB — CBC WITH DIFFERENTIAL/PLATELET
BASOS ABS: 0 10*3/uL (ref 0.0–0.1)
Basophils Relative: 0.3 % (ref 0.0–3.0)
Eosinophils Absolute: 0.2 10*3/uL (ref 0.0–0.7)
Eosinophils Relative: 1.6 % (ref 0.0–5.0)
HCT: 40.7 % (ref 36.0–46.0)
HEMOGLOBIN: 13.4 g/dL (ref 12.0–15.0)
Lymphocytes Relative: 18.6 % (ref 12.0–46.0)
Lymphs Abs: 2.1 10*3/uL (ref 0.7–4.0)
MCHC: 33 g/dL (ref 30.0–36.0)
MCV: 85.7 fl (ref 78.0–100.0)
MONO ABS: 0.8 10*3/uL (ref 0.1–1.0)
MONOS PCT: 7.3 % (ref 3.0–12.0)
NEUTROS ABS: 8.1 10*3/uL — AB (ref 1.4–7.7)
Neutrophils Relative %: 72.2 % (ref 43.0–77.0)
PLATELETS: 212 10*3/uL (ref 150.0–400.0)
RBC: 4.74 Mil/uL (ref 3.87–5.11)
RDW: 15.2 % (ref 11.5–15.5)
WBC: 11.3 10*3/uL — ABNORMAL HIGH (ref 4.0–10.5)

## 2014-08-10 ENCOUNTER — Telehealth: Payer: Self-pay

## 2014-08-10 NOTE — Telephone Encounter (Signed)
Pt called to get recent lab results; pt has not been on my chart yet; pt advised per my chart message and pt will go on mychart to review values.

## 2014-12-01 ENCOUNTER — Telehealth: Payer: Self-pay | Admitting: Internal Medicine

## 2014-12-01 NOTE — Telephone Encounter (Signed)
It may be too early to need antibiotic but I will leave that up to Dr Damita Dunnings. I often give printed Rx to be started if symptoms worsen or persist

## 2014-12-01 NOTE — Telephone Encounter (Signed)
Patient Name: Theresa Huber  DOB: [birth year]    Nurse Assessment  Nurse: Marcelline Deist, RN, Lynda Date/Time (Eastern Time): 12/01/2014 1:23:07 PM  Confirm and document reason for call. If symptomatic, describe symptoms. ---Caller states she has sinus congestion, unable to be seen today because husband has to work. Runny nose, unable to sleep. No fever. Has a cough which is making her nauseous & has had vomiting. The mucus is white. has had symptoms for 3 days.  Has the patient traveled out of the country within the last 30 days? ---Not Applicable  Does the patient require triage? ---Yes  Related visit to physician within the last 2 weeks? ---No  Does the PT have any chronic conditions? (i.e. diabetes, asthma, etc.) ---No     Guidelines    Guideline Title Affirmed Question Affirmed Notes  Sinus Pain or Congestion Earache    Final Disposition User   See Physician within 24 Hours [place], RN, [place]    Comments  Pt. feels she needs an antibiotic. Every time she gets this sinus issue, it ends up as an infection & she requires an antibiotic. Appt. scheduled with another provider tomorrow morning to be seen.

## 2014-12-02 ENCOUNTER — Ambulatory Visit (INDEPENDENT_AMBULATORY_CARE_PROVIDER_SITE_OTHER): Payer: 59 | Admitting: Family Medicine

## 2014-12-02 ENCOUNTER — Encounter: Payer: Self-pay | Admitting: Family Medicine

## 2014-12-02 VITALS — BP 116/88 | HR 99 | Temp 98.3°F | Wt 165.2 lb

## 2014-12-02 DIAGNOSIS — J0100 Acute maxillary sinusitis, unspecified: Secondary | ICD-10-CM

## 2014-12-02 MED ORDER — FLUCONAZOLE 150 MG PO TABS
150.0000 mg | ORAL_TABLET | Freq: Once | ORAL | Status: DC
Start: 2014-12-02 — End: 2014-12-29

## 2014-12-02 MED ORDER — AMOXICILLIN-POT CLAVULANATE 875-125 MG PO TABS: 1.0000 | ORAL_TABLET | Freq: Two times a day (BID) | ORAL | Status: DC

## 2014-12-02 MED ORDER — FLUTICASONE PROPIONATE 50 MCG/ACT NA SUSP: 2.0000 | Freq: Every day | NASAL | Status: DC

## 2014-12-02 NOTE — Progress Notes (Signed)
Pre visit review using our clinic review tool, if applicable. No additional management support is needed unless otherwise documented below in the visit note.  Sick for the last few days.  Stuffy, post nasal gtt.  Sinus pressure, cough, vomiting from swallowed mucous.  More head pressure today.  No fevers.  tmax 99.2.   Foul taste in her mouth.  Some sputum with cough.  Ears feel stuffy. Frontal > max pain B.   Meds, vitals, and allergies reviewed.   ROS: See HPI.  Otherwise, noncontributory.  GEN: nad, alert and oriented HEENT: mucous membranes moist, tm w/o erythema, nasal exam w/o erythema, clear discharge noted,  OP with cobblestoning, Max > frontal sinuses ttp NECK: supple w/o LA CV: rrr.   PULM: ctab, no inc wob EXT: no edema SKIN: no acute rash

## 2014-12-02 NOTE — Patient Instructions (Signed)
Use flonase daily and start augmentin today.  Try to get some rest.  Drink plenty of fluids.  Take care.

## 2014-12-05 DIAGNOSIS — J320 Chronic maxillary sinusitis: Secondary | ICD-10-CM | POA: Insufficient documentation

## 2014-12-05 NOTE — Assessment & Plan Note (Signed)
Use flonase daily and start augmentin today.  Rest and fluids o/w.  Hold diflucan, use if needed.   She agrees.

## 2014-12-28 ENCOUNTER — Telehealth: Payer: Self-pay | Admitting: Internal Medicine

## 2014-12-28 NOTE — Telephone Encounter (Signed)
[facility] Patient Name: Theresa Huber DOB: [birth year] Initial Comment Caller states, she has been itching for the last 6 months, vaginal, Nurse Assessment Nurse: Ronnald Ramp, RN, Miranda Date/Time (Eastern Time): 12/28/2014 12:20:19 PM Confirm and document reason for call. If symptomatic, describe symptoms. ---Caller states she has had vaginal itching for 6 months. She has tried different OTC meds without resolution. Has the patient traveled out of the country within the last 30 days? ---Not Applicable Does the patient require triage? ---Yes Related visit to physician within the last 2 weeks? ---No Does the PT have any chronic conditions? (i.e. diabetes, asthma, etc.) ---Yes List chronic conditions. ---GERD Guidelines Guideline Title Affirmed Question Affirmed Notes [place] Symptoms MODERATE-SEVERE itching (i.e., interferes with school, work, or sleep) Final Disposition User See Physician within 24 Hours [place], RN, Miranda Comments Appt scheduled with Dr. Silvio Pate at 2:30 tomorrow 12/29/14

## 2014-12-28 NOTE — Telephone Encounter (Signed)
Will evaluate at the appointment

## 2014-12-28 NOTE — Telephone Encounter (Signed)
Pt has appt with Dr Silvio Pate 12/29/14 at 2:30 pm.

## 2014-12-29 ENCOUNTER — Ambulatory Visit (INDEPENDENT_AMBULATORY_CARE_PROVIDER_SITE_OTHER): Payer: 59 | Admitting: Internal Medicine

## 2014-12-29 ENCOUNTER — Encounter: Payer: Self-pay | Admitting: Internal Medicine

## 2014-12-29 VITALS — BP 128/74 | HR 105 | Temp 98.2°F | Wt 166.5 lb

## 2014-12-29 DIAGNOSIS — B3731 Acute candidiasis of vulva and vagina: Secondary | ICD-10-CM

## 2014-12-29 DIAGNOSIS — B373 Candidiasis of vulva and vagina: Secondary | ICD-10-CM | POA: Insufficient documentation

## 2014-12-29 MED ORDER — TERCONAZOLE 0.4 % VA CREA: 1.0000 | TOPICAL_CREAM | Freq: Every day | VAGINAL | Status: DC

## 2014-12-29 MED ORDER — FLUCONAZOLE 100 MG PO TABS: 100.0000 mg | ORAL_TABLET | Freq: Every day | ORAL | Status: DC

## 2014-12-29 NOTE — Assessment & Plan Note (Signed)
Resistant to single fluconazole dose Will give terazole and repeat the fluconazole

## 2014-12-29 NOTE — Progress Notes (Signed)
   Subjective:    Patient ID: Theresa Huber, female    DOB: 28-Sep-1951, 64 y.o.   MRN: [id number]  HPI Having problems with vaginal itching for 2 months or so--only on outside No discharge Now can see a mole outside her labia--for a month. It does get irritated  No sex No urinary problems  Tried monistat cream but it didn't help  Current Outpatient Prescriptions on File Prior to Visit  Medication Sig Dispense Refill  . Calcium Carbonate-Vitamin D (CALTRATE 600+D) 600-400 MG-UNIT per tablet Take 1 tablet by mouth 2 (two) times daily.      . fluticasone (FLONASE) 50 MCG/ACT nasal spray Place 2 sprays into both nostrils daily. 16 g 0  . pantoprazole (PROTONIX) 40 MG tablet TAKE 1 TABLET (40 MG TOTAL) BY MOUTH  DAILY.     No current facility-administered medications on file prior to visit.    Allergies  Allergen Reactions  . Shellfish Allergy Hives and Itching  . Ibandronate Sodium     REACTION: bad chest pain  . Sulfonamide Derivatives     REACTION: hives    Past Medical History  Diagnosis Date  . GERD (gastroesophageal reflux disease)   . IBS (irritable bowel syndrome)   . HLD (hyperlipidemia)   . Osteoporosis     T score -2.5 at hip in 2008    Past Surgical History  Procedure Laterality Date  . Cesarean section  1983  . Abdominal hysterectomy    . Knee arthroscopy  1990    LEFT  . Lipoma excision  1980    LEFT THIGH    Family History  Problem Relation Age of Onset  . Diabetes Mother   . Heart disease Mother     ? A FIB  . Cancer Mother     ? sCHWANOMMA, SKIN CANCER  . Sleep apnea Mother   . Dementia Mother 49    Altzheimer's  . COPD Father   . Hyperlipidemia Father   . Sleep apnea Father   . Emphysema Father   . Mitral valve prolapse Sister   . Cancer Sister     MELANOMA    History   Social History  . Marital Status: Married    Spouse Name: N/A  . Number of Children: 1  . Years of Education: N/A   Occupational History  . Receiving [employer]   Social History Main Topics  . Smoking status: Former Smoker -- 2.00 packs/day for 8 years    Types: Cigarettes    Quit date: 11/18/1984  . Smokeless tobacco: Never Used  . Alcohol Use: No  . Drug Use: No  . Sexual Activity: Not on file   Other Topics Concern  . Not on file   Social History Narrative    Review of Systems Did have antibiotics recently-- got antifungal pill Doesn't think this feels like yeast    Objective:   Physical Exam  Genitourinary:  Mild vaginal atrophy and slight redness inside labia Whitish discharge No sclerosis No nevi or worrisome lesions  Wet prep--no trich. No sig Clue cells Whiff test ?slightly positive Yeast seen on KOH          Assessment & Plan:

## 2014-12-29 NOTE — Progress Notes (Signed)
Pre visit review using our clinic review tool, if applicable. No additional management support is needed unless otherwise documented below in the visit note. 

## 2015-01-18 ENCOUNTER — Other Ambulatory Visit: Payer: Self-pay | Admitting: Family Medicine

## 2015-01-18 ENCOUNTER — Other Ambulatory Visit: Payer: Self-pay | Admitting: Internal Medicine

## 2015-08-09 ENCOUNTER — Encounter: Payer: Self-pay | Admitting: Internal Medicine

## 2015-08-09 ENCOUNTER — Ambulatory Visit (INDEPENDENT_AMBULATORY_CARE_PROVIDER_SITE_OTHER): Payer: 59 | Admitting: Internal Medicine

## 2015-08-09 VITALS — BP 140/90 | HR 74 | Temp 97.8°F | Ht 62.0 in | Wt 171.0 lb

## 2015-08-09 DIAGNOSIS — Z23 Encounter for immunization: Secondary | ICD-10-CM

## 2015-08-09 DIAGNOSIS — E785 Hyperlipidemia, unspecified: Secondary | ICD-10-CM | POA: Diagnosis not present

## 2015-08-09 DIAGNOSIS — K219 Gastro-esophageal reflux disease without esophagitis: Secondary | ICD-10-CM | POA: Diagnosis not present

## 2015-08-09 DIAGNOSIS — Z0000 Encounter for general adult medical examination without abnormal findings: Secondary | ICD-10-CM | POA: Diagnosis not present

## 2015-08-09 LAB — COMPREHENSIVE METABOLIC PANEL
ALT: 15 U/L (ref 0–35)
AST: 16 U/L (ref 0–37)
Albumin: 3.9 g/dL (ref 3.5–5.2)
Alkaline Phosphatase: 119 U/L — ABNORMAL HIGH (ref 39–117)
BILIRUBIN TOTAL: 0.4 mg/dL (ref 0.2–1.2)
BUN: 24 mg/dL — ABNORMAL HIGH (ref 6–23)
CALCIUM: 9.4 mg/dL (ref 8.4–10.5)
CHLORIDE: 104 meq/L (ref 96–112)
CO2: 33 meq/L — AB (ref 19–32)
CREATININE: 0.81 mg/dL (ref 0.40–1.20)
GFR: 75.92 mL/min (ref >60.00)
Glucose, Bld: 108 mg/dL — ABNORMAL HIGH (ref 70–99)
Potassium: 4.5 mEq/L (ref 3.5–5.1)
Sodium: 140 mEq/L (ref 135–145)
Total Protein: 7.2 g/dL (ref 6.0–8.3)

## 2015-08-09 LAB — CBC WITH DIFFERENTIAL/PLATELET
BASOS PCT: 0.6 % (ref 0.0–3.0)
Basophils Absolute: 0.1 10*3/uL (ref 0.0–0.1)
EOS ABS: 0.2 10*3/uL (ref 0.0–0.7)
Eosinophils Relative: 1.6 % (ref 0.0–5.0)
HEMATOCRIT: 40.3 % (ref 36.0–46.0)
Hemoglobin: 13.6 g/dL (ref 12.0–15.0)
LYMPHS ABS: 2.4 10*3/uL (ref 0.7–4.0)
LYMPHS PCT: 24.7 % (ref 12.0–46.0)
MCHC: 33.7 g/dL (ref 30.0–36.0)
MCV: 83.5 fl (ref 78.0–100.0)
Monocytes Absolute: 0.8 10*3/uL (ref 0.1–1.0)
Monocytes Relative: 7.7 % (ref 3.0–12.0)
NEUTROS ABS: 6.4 10*3/uL (ref 1.4–7.7)
NEUTROS PCT: 65.4 % (ref 43.0–77.0)
PLATELETS: 227 10*3/uL (ref 150.0–400.0)
RBC: 4.83 Mil/uL (ref 3.87–5.11)
RDW: 14.5 % (ref 11.5–15.5)
WBC: 9.8 10*3/uL (ref 4.0–10.5)

## 2015-08-09 LAB — LIPID PANEL
CHOL/HDL RATIO: 4
CHOLESTEROL: 197 mg/dL (ref 0–200)
HDL: 48.8 mg/dL (ref >39.00)
LDL Cholesterol: 119 mg/dL — ABNORMAL HIGH (ref 0–99)
NonHDL: 148.39
Triglycerides: 149 mg/dL (ref 0.0–149.0)
VLDL: 29.8 mg/dL (ref 0.0–40.0)

## 2015-08-09 LAB — T4, FREE: Free T4: 0.93 ng/dL (ref 0.60–1.60)

## 2015-08-09 NOTE — Addendum Note (Signed)
Addended by: Despina Hidden on: 08/09/2015 11:34 AM   Modules accepted: Orders

## 2015-08-09 NOTE — Assessment & Plan Note (Signed)
Has LPR If worsens, may need to go back to bid

## 2015-08-09 NOTE — Assessment & Plan Note (Signed)
Healthy but really needs to work on fitness Colon due 2020 Mammogram due now Flu and Tdap vaccines

## 2015-08-09 NOTE — Patient Instructions (Addendum)
Please set up your screening mammogram.  DASH Eating Plan DASH stands for "Dietary Approaches to Stop Hypertension." The DASH eating plan is a healthy eating plan that has been shown to reduce high blood pressure (hypertension). Additional health benefits may include reducing the risk of type 2 diabetes mellitus, heart disease, and stroke. The DASH eating plan may also help with weight loss. WHAT DO I NEED TO KNOW ABOUT THE DASH EATING PLAN? For the DASH eating plan, you will follow these general guidelines:  Choose foods with a percent daily value for sodium of less than 5% (as listed on the food label).  Use salt-free seasonings or herbs instead of table salt or sea salt.  Check with your health care provider or pharmacist before using salt substitutes.  Eat lower-sodium products, often labeled as "lower sodium" or "no salt added."  Eat fresh foods.  Eat more vegetables, fruits, and low-fat dairy products.  Choose whole grains. Look for the word "whole" as the first word in the ingredient list.  Choose fish and skinless chicken or turkey more often than red meat. Limit fish, poultry, and meat to 6 oz (170 g) each day.  Limit sweets, desserts, sugars, and sugary drinks.  Choose heart-healthy fats.  Limit cheese to 1 oz (28 g) per day.  Eat more home-cooked food and less restaurant, buffet, and fast food.  Limit fried foods.  Cook foods using methods other than frying.  Limit canned vegetables. If you do use them, rinse them well to decrease the sodium.  When eating at a restaurant, ask that your food be prepared with less salt, or no salt if possible. WHAT FOODS CAN I EAT? Seek help from a dietitian for individual calorie needs. Grains Whole grain or whole wheat bread. Ieisha Gao rice. Whole grain or whole wheat pasta. Quinoa, bulgur, and whole grain cereals. Low-sodium cereals. Corn or whole wheat flour tortillas. Whole grain cornbread. Whole grain crackers. Low-sodium  crackers. Vegetables Fresh or frozen vegetables (raw, steamed, roasted, or grilled). Low-sodium or reduced-sodium tomato and vegetable juices. Low-sodium or reduced-sodium tomato sauce and paste. Low-sodium or reduced-sodium canned vegetables.  Fruits All fresh, canned (in natural juice), or frozen fruits. Meat and Other Protein Products Ground beef (85% or leaner), grass-fed beef, or beef trimmed of fat. Skinless chicken or turkey. Ground chicken or turkey. Pork trimmed of fat. All fish and seafood. Eggs. Dried beans, peas, or lentils. Unsalted nuts and seeds. Unsalted canned beans. Dairy Low-fat dairy products, such as skim or 1% milk, 2% or reduced-fat cheeses, low-fat ricotta or cottage cheese, or plain low-fat yogurt. Low-sodium or reduced-sodium cheeses. Fats and Oils Tub margarines without trans fats. Light or reduced-fat mayonnaise and salad dressings (reduced sodium). Avocado. Safflower, olive, or canola oils. Natural peanut or almond butter. Other Unsalted popcorn and pretzels. The items listed above may not be a complete list of recommended foods or beverages. Contact your dietitian for more options. WHAT FOODS ARE NOT RECOMMENDED? Grains White bread. White pasta. White rice. Refined cornbread. Bagels and croissants. Crackers that contain trans fat. Vegetables Creamed or fried vegetables. Vegetables in a cheese sauce. Regular canned vegetables. Regular canned tomato sauce and paste. Regular tomato and vegetable juices. Fruits Dried fruits. Canned fruit in light or heavy syrup. Fruit juice. Meat and Other Protein Products Fatty cuts of meat. Ribs, chicken wings, bacon, sausage, bologna, salami, chitterlings, fatback, hot dogs, bratwurst, and packaged luncheon meats. Salted nuts and seeds. Canned beans with salt. Dairy Whole or 2% milk, cream, half-and-half, and   cream cheese. Whole-fat or sweetened yogurt. Full-fat cheeses or blue cheese. Nondairy creamers and whipped toppings.  Processed cheese, cheese spreads, or cheese curds. Condiments Onion and garlic salt, seasoned salt, table salt, and sea salt. Canned and packaged gravies. Worcestershire sauce. Tartar sauce. Barbecue sauce. Teriyaki sauce. Soy sauce, including reduced sodium. Steak sauce. Fish sauce. Oyster sauce. Cocktail sauce. Horseradish. Ketchup and mustard. Meat flavorings and tenderizers. Bouillon cubes. Hot sauce. Tabasco sauce. Marinades. Taco seasonings. Relishes. Fats and Oils Butter, stick margarine, lard, shortening, ghee, and bacon fat. Coconut, palm kernel, or palm oils. Regular salad dressings. Other Pickles and olives. Salted popcorn and pretzels. The items listed above may not be a complete list of foods and beverages to avoid. Contact your dietitian for more information. WHERE CAN I FIND MORE INFORMATION? National Heart, Lung, and Blood Institute: www.nhlbi.nih.gov/health/health-topics/topics/dash/ Document Released: 10/24/2011 Document Revised: 03/21/2014 Document Reviewed: 09/08/2013 ExitCare Patient Information 2015 [employer]. This information is not intended to replace advice given to you by your health care provider. Make sure you discuss any questions you have with your health care provider.  

## 2015-08-09 NOTE — Progress Notes (Signed)
Pre visit review using our clinic review tool, if applicable. No additional management support is needed unless otherwise documented below in the visit note. 

## 2015-08-09 NOTE — Assessment & Plan Note (Signed)
Mild Discussed primary prevention---will hold off

## 2015-08-09 NOTE — Progress Notes (Signed)
Subjective:    Patient ID: Theresa Huber, female    DOB: [birth year], 64 y.o.   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 Calcium Carbonate-Vitamin D (CALTRATE 600+D) 600-400 MG-UNIT per tablet Take 1 tablet by mouth 2 (two) times daily.      . pantoprazole (PROTONIX) 40 MG tablet TAKE 1 TABLET (40 MG TOTAL) BY MOUTH 2 (TWO) TIMES DAILY. 180 tablet 3   No current facility-administered medications on file prior to visit.    Allergies  Allergen Reactions  . Shellfish Allergy Hives and Itching  . Ibandronate Sodium     REACTION: bad chest pain  . Sulfonamide Derivatives     REACTION: hives    Past Medical History  Diagnosis Date  . GERD (gastroesophageal reflux disease)   . IBS (irritable bowel syndrome)   . HLD (hyperlipidemia)   . Osteoporosis     T score -2.5 at hip in 2008    Past Surgical History  Procedure Laterality Date  . Cesarean section  1983  . Abdominal hysterectomy    . Knee arthroscopy  1990    LEFT  . Lipoma excision  1980    LEFT THIGH    Family History  Problem Relation Age of Onset  . Diabetes Mother   . Heart disease Mother     ? A FIB  . Cancer Mother     ? sCHWANOMMA, SKIN CANCER  . Sleep apnea Mother   . Dementia Mother 77    Alzheimer's  . COPD Father   . Hyperlipidemia Father   . Sleep apnea Father   . Emphysema Father   . Mitral valve prolapse Sister   . Cancer Sister     MELANOMA    Social History   Social History  . Marital Status: Married    Spouse Name: N/A  . Number of Children: 1  . Years of  Education: N/A   Occupational History  . Receiving [employer]   Social History Main Topics  . Smoking status: Former Smoker -- 2.00 packs/day for 8 years    Types: Cigarettes    Quit date: 11/18/1984  . Smokeless tobacco: Never Used  . Alcohol Use: No  . Drug Use: No  . Sexual Activity: Not on file   Other Topics Concern  . Not on file   Social History Narrative   Review of Systems  Constitutional: Positive for unexpected weight change. Negative for fatigue.       Wears seat belt  HENT: Positive for tinnitus. Negative for dental problem, hearing loss and trouble swallowing.        Keeps up with dentist  Eyes: Negative for visual disturbance.       No diplopia or unilateral vision loss Early cataracts  Respiratory: Positive for cough. Negative for chest tightness and shortness of breath.   Cardiovascular: Negative for chest pain, palpitations and leg swelling.  Gastrointestinal: Negative for nausea, abdominal pain, constipation and blood in stool.  Endocrine: Negative for polydipsia and polyuria.  Genitourinary: Negative for dysuria and hematuria.       No sex--  no problem  Musculoskeletal: Positive for myalgias. Negative for back pain, joint swelling and arthralgias.       Leg cramps at times  Skin: Negative for rash.       Has mole on back--no change  Allergic/Immunologic: Positive for environmental allergies. Negative for immunocompromised state.       Seasonal symptoms-- rarely uses meds  Neurological: Negative for dizziness, syncope, weakness, light-headedness, numbness and headaches.  Hematological: Negative for adenopathy. Does not bruise/bleed easily.  Psychiatric/Behavioral: Negative for dysphoric mood. The patient is not nervous/anxious.        Kept up by dog at times       Objective:   Physical Exam  Constitutional: She is oriented to person, place, and time. She appears well-developed and well-nourished. No distress.  HENT:  Head: Normocephalic and  atraumatic.  Right Ear: External ear normal.  Left Ear: External ear normal.  Mouth/Throat: Oropharynx is clear and moist. No oropharyngeal exudate.  Eyes: Conjunctivae and EOM are normal. Pupils are equal, round, and reactive to light.  Neck: Normal range of motion. Neck supple. No thyromegaly present.  Cardiovascular: Normal rate, regular rhythm, normal heart sounds and intact distal pulses.  Exam reveals no gallop.   No murmur heard. Pulmonary/Chest: Effort normal and breath sounds normal. No respiratory distress. She has no wheezes. She has no rales.  Abdominal: Soft. There is no tenderness.  Musculoskeletal: She exhibits no edema or tenderness.  Lymphadenopathy:    She has no cervical adenopathy.  Neurological: She is alert and oriented to person, place, and time.  Skin: No rash noted. No erythema.  seb keratosis on back  Psychiatric: She has a normal mood and affect. Her behavior is normal.          Assessment & Plan:

## 2016-01-26 ENCOUNTER — Other Ambulatory Visit: Payer: Self-pay | Admitting: Internal Medicine

## 2016-02-09 ENCOUNTER — Telehealth: Payer: Self-pay | Admitting: Internal Medicine

## 2016-02-09 NOTE — Telephone Encounter (Signed)
Patient Name: Theresa Huber DOB: [id number] Initial Comment Caller States woke up at 6am with heart racing. it is not racing now. Nurse Assessment Nurse: Marcelline Deist, RN, Lynda Date/Time (Eastern Time): 02/09/2016 8:32:01 AM Confirm and document reason for call. If symptomatic, describe symptoms. You must click the next button to save text entered. ---Caller states she woke up at 3 am with heart racing, it lasted around 15 minutes. It is not racing now. Started taking a Bayer aspirin, started yesterday. Has the patient traveled out of the country within the last 30 days? ---Not Applicable Does the patient have any new or worsening symptoms? ---Yes Will a triage be completed? ---Yes Related visit to physician within the last 2 weeks? ---No Does the PT have any chronic conditions? (i.e. diabetes, asthma, etc.) ---Yes List chronic conditions. ---acid reflux, borderline diabetes & cholesterol, but not on rxs. Is this a behavioral health or substance abuse call? ---No Guidelines Guideline Title Affirmed Question Affirmed Notes Heart Rate and Heartbeat Questions [1] Heart beating very rapidly (e.g., > 140 / minute) AND [2] not present now (Exception: during exercise) Final Disposition User See Physician within 4 Hours (or PCP triage) Marcelline Deist, RN, Lynda Comments Caller scheduled for Monday with her Dr. as no availability on today's schedule. Advised that if she develops symptoms again, she can be seen at an UC/ER, or call back. Caller asked what causes this. Nurse responded that this could be related to an irregular rhythm of the heart. She states her mother & sister both have that. Referrals REFERRED TO PCP OFFICE Disagree/Comply: Comply

## 2016-02-09 NOTE — Telephone Encounter (Signed)
Will evaluate at OV 

## 2016-02-09 NOTE — Telephone Encounter (Signed)
TH scheduled appt with Dr Silvio Pate on 02/12/16 at 11:15.

## 2016-02-12 ENCOUNTER — Telehealth: Payer: Self-pay | Admitting: Internal Medicine

## 2016-02-12 ENCOUNTER — Ambulatory Visit: Payer: Self-pay | Admitting: Internal Medicine

## 2016-02-12 ENCOUNTER — Ambulatory Visit (INDEPENDENT_AMBULATORY_CARE_PROVIDER_SITE_OTHER): Payer: 59 | Admitting: Family Medicine

## 2016-02-12 ENCOUNTER — Encounter: Payer: Self-pay | Admitting: Family Medicine

## 2016-02-12 VITALS — BP 126/80 | HR 86 | Temp 97.6°F | Wt 173.5 lb

## 2016-02-12 DIAGNOSIS — R002 Palpitations: Secondary | ICD-10-CM

## 2016-02-12 MED ORDER — METOPROLOL TARTRATE 25 MG PO TABS: 12.5000 mg | ORAL_TABLET | Freq: Two times a day (BID) | ORAL | Status: DC | PRN

## 2016-02-12 NOTE — Telephone Encounter (Signed)
Patient Name: Theresa Huber DOB: [id number] Initial Comment Caller states, The Appt Line sent this caller to [place] -- she says her appt was set for 2 pm today, but the office has it slotted at 1115am. The patient wants to speak w/ the nurse to help get her appt at 2 pm as she says she placed at that time. Nurse Assessment Nurse: Marcelline Deist, RN, Lynda Date/Time (Eastern Time): 02/12/2016 12:26:10 PM Confirm and document reason for call. If symptomatic, describe symptoms. You must click the next button to save text entered. ---Caller states, The Appt Line sent this caller to [place]. She says her appt was set for 2 pm today, but the office has it slotted at 1115am. The patient wants to speak w/ the nurse to help get her appt at 2 pm as she says she placed at that time. Has the patient traveled out of the country within the last 30 days? ---Not Applicable Does the patient have any new or worsening symptoms? ---Yes Will a triage be completed? ---No Select reason for no triage. ---Other Please document clinical information provided and list any resource used. ---Nurse made an appt. for the patient with Dr. Damita Dunnings as she did not get a reminder call from office regarding her appt. time, and she was under the understanding that her appt. was later this afternoon with Dr. Silvio Pate. Nurse thought she had scheduled patient for this afternoon as well. Guidelines Guideline Title Affirmed Question Affirmed Notes Final Disposition User [employer], RN, Kermit Balo

## 2016-02-12 NOTE — Telephone Encounter (Signed)
I left v/m per DPR on pts cell that pt has 2 appts scheduled on 02/12/16; at 11:15 Am scheduled with Dr Silvio Pate and at 3:00 pm scheduled with Dr Damita Dunnings; both appts scheduled by [facility]. Asked pt to cb to confirm which appt was going to keep and which appt to cancel. FYI to Dr Silvio Pate and Dr Damita Dunnings.

## 2016-02-12 NOTE — Patient Instructions (Signed)
Go to the lab on the way out.  We'll contact you with your lab report. If you have more episodes, then start the metoprolol and update [place] as needed.  Take care.  Glad to see you.

## 2016-02-12 NOTE — Progress Notes (Signed)
Pre visit review using our clinic review tool, if applicable. No additional management support is needed unless otherwise documented below in the visit note.  Sx started about ~3 days ago.  Woke up with heart racing at about 3 AM.  Lasted about 10 minutes.  Self resolved.  Had another episode during sleep about 1 day later.  2 total episodes.  Mother with A fib, sister with valve disease.  No CP, BLE edema.  She gets some SOB with walking about 1/4 mile, walking her dog.  That is baseline for her and not a recent change.  She didn't have palpitations with walking.  She doesn't get much exercise.  She doesn't have other episodes of heart racing.  She didn't do anything to make the episodes better.    1 cup coffee daily, not a change.    No h/o CAD.   PMH and SH reviewed  ROS: See HPI, otherwise noncontributory.  Meds, vitals, and allergies reviewed.   GEN: nad, alert and oriented HEENT: mucous membranes moist NECK: supple w/o LA CV: rrr.  no murmur PULM: ctab, no inc wob ABD: soft, +bs EXT: no edema  EKG wnl, d/w pt.

## 2016-02-12 NOTE — Telephone Encounter (Signed)
Please follow up on this--it would be good to open one of these up

## 2016-02-12 NOTE — Telephone Encounter (Signed)
I spoke with patient and she insisted she never had an 11:15 appointment.  Patient will be keeping the 3:00 appointment. I cancelled the 11:15 appointment.

## 2016-02-12 NOTE — Telephone Encounter (Signed)
Okay, thanks

## 2016-02-13 ENCOUNTER — Encounter: Payer: Self-pay | Admitting: Family Medicine

## 2016-02-13 DIAGNOSIS — R002 Palpitations: Secondary | ICD-10-CM | POA: Insufficient documentation

## 2016-02-13 LAB — CBC WITH DIFFERENTIAL/PLATELET
BASOS ABS: 0 10*3/uL (ref 0.0–0.1)
Basophils Relative: 0.3 % (ref 0.0–3.0)
EOS ABS: 0.2 10*3/uL (ref 0.0–0.7)
Eosinophils Relative: 1.7 % (ref 0.0–5.0)
HCT: 40.5 % (ref 36.0–46.0)
Hemoglobin: 13.5 g/dL (ref 12.0–15.0)
LYMPHS ABS: 2.7 10*3/uL (ref 0.7–4.0)
Lymphocytes Relative: 21.9 % (ref 12.0–46.0)
MCHC: 33.3 g/dL (ref 30.0–36.0)
MCV: 84.3 fl (ref 78.0–100.0)
MONO ABS: 1 10*3/uL (ref 0.1–1.0)
Monocytes Relative: 8.5 % (ref 3.0–12.0)
NEUTROS PCT: 67.6 % (ref 43.0–77.0)
Neutro Abs: 8.2 10*3/uL — ABNORMAL HIGH (ref 1.4–7.7)
Platelets: 227 10*3/uL (ref 150.0–400.0)
RBC: 4.8 Mil/uL (ref 3.87–5.11)
RDW: 15.4 % (ref 11.5–15.5)
WBC: 12.1 10*3/uL — ABNORMAL HIGH (ref 4.0–10.5)

## 2016-02-13 LAB — COMPREHENSIVE METABOLIC PANEL
ALK PHOS: 111 U/L (ref 39–117)
ALT: 17 U/L (ref 0–35)
AST: 18 U/L (ref 0–37)
Albumin: 3.9 g/dL (ref 3.5–5.2)
BUN: 21 mg/dL (ref 6–23)
CO2: 29 mEq/L (ref 19–32)
Calcium: 9.3 mg/dL (ref 8.4–10.5)
Chloride: 104 mEq/L (ref 96–112)
Creatinine, Ser: 0.71 mg/dL (ref 0.40–1.20)
GFR: 88.24 mL/min (ref >60.00)
GLUCOSE: 81 mg/dL (ref 70–99)
POTASSIUM: 4.7 meq/L (ref 3.5–5.1)
SODIUM: 140 meq/L (ref 135–145)
TOTAL PROTEIN: 7.1 g/dL (ref 6.0–8.3)
Total Bilirubin: 0.4 mg/dL (ref 0.2–1.2)

## 2016-02-13 LAB — TSH: TSH: 0.98 u[IU]/mL (ref 0.35–4.50)

## 2016-02-13 NOTE — Assessment & Plan Note (Signed)
2 episodes at rest.  No exertional sx otherwise, other than apparent baseline deconditioning.   EKG wnl. Check labs today.  If more episodes, then start the metoprolol and update [place] as needed.  Okay for outpatient f/u.  Can start gradual exercise program with slow inc in walking.  D/w pt.  >25 minutes spent in face to face time with patient, >50% spent in counselling or coordination of care.

## 2016-03-11 ENCOUNTER — Ambulatory Visit: Payer: 59 | Admitting: Family Medicine

## 2016-03-11 ENCOUNTER — Telehealth: Payer: Self-pay | Admitting: *Deleted

## 2016-03-11 ENCOUNTER — Encounter: Payer: Self-pay | Admitting: Primary Care

## 2016-03-11 ENCOUNTER — Ambulatory Visit (INDEPENDENT_AMBULATORY_CARE_PROVIDER_SITE_OTHER): Payer: 59 | Admitting: Primary Care

## 2016-03-11 VITALS — BP 124/84 | HR 80 | Temp 98.2°F | Ht 62.0 in | Wt 168.1 lb

## 2016-03-11 DIAGNOSIS — L237 Allergic contact dermatitis due to plants, except food: Secondary | ICD-10-CM

## 2016-03-11 MED ORDER — PREDNISONE 20 MG PO TABS: ORAL_TABLET | ORAL | Status: DC

## 2016-03-11 MED ORDER — TRIAMCINOLONE ACETONIDE 0.1 % EX CREA: 1.0000 "application " | TOPICAL_CREAM | Freq: Two times a day (BID) | CUTANEOUS | Status: DC

## 2016-03-11 NOTE — Progress Notes (Signed)
Pre visit review using our clinic review tool, if applicable. No additional management support is needed unless otherwise documented below in the visit note. 

## 2016-03-11 NOTE — Patient Instructions (Signed)
Start prednisone for [employer]. Take 3 tablets for 3 days, then 2 tablets for 3 days, then 1 tablet for 3 days, then 1/2 tablet for 3 days.   You may use the triamcinolone cream twice daily as needed for itching. Ensure you apply a thin layer and wash your hands after use.  Wash all clothing and bed sheets to prevent further spread.   Poison [employer] ivy is a inflammation of the skin (contact dermatitis) caused by touching the allergens on the leaves of the ivy plant following previous exposure to the plant. The rash usually appears 48 hours after exposure. The rash is usually bumps (papules) or blisters (vesicles) in a linear pattern. Depending on your own sensitivity, the rash may simply cause redness and itching, or it may also progress to blisters which may break open. These must be well cared for to prevent secondary bacterial (germ) infection, followed by scarring. Keep any open areas dry, clean, dressed, and covered with an antibacterial ointment if needed. The eyes may also get puffy. The puffiness is worst in the morning and gets better as the day progresses. This dermatitis usually heals without scarring, within 2 to 3 weeks without treatment. HOME CARE INSTRUCTIONS  Thoroughly wash with soap and water as soon as you have been exposed to poison ivy. You have about one half hour to remove the plant resin before it will cause the rash. This washing will destroy the oil or antigen on the skin that is causing, or will cause, the rash. Be sure to wash under your fingernails as any plant resin there will continue to spread the rash. Do not rub skin vigorously when washing affected area. Poison ivy cannot spread if no oil from the plant remains on your body. A rash that has progressed to weeping sores will not spread the rash unless you have not washed thoroughly. It is also important to wash any clothes you have been wearing as these may carry active allergens. The rash will return if you wear the  unwashed clothing, even several days later. Avoidance of the plant in the future is the best measure. Poison ivy plant can be recognized by the number of leaves. Generally, poison ivy has three leaves with flowering branches on a single stem. Diphenhydramine may be purchased over the counter and used as needed for itching. Do not drive with this medication if it makes you drowsy.Ask your caregiver about medication for children. SEEK MEDICAL CARE IF:  Open sores develop.  Redness spreads beyond area of rash.  You notice purulent (pus-like) discharge.  You have increased pain.  Other signs of infection develop (such as fever).   This information is not intended to replace advice given to you by your health care provider. Make sure you discuss any questions you have with your health care provider.   Document Released: 11/01/2000 Document Revised: 01/27/2012 Document Reviewed: 04/12/2015 Elsevier Interactive Patient Education [employer].

## 2016-03-11 NOTE — Telephone Encounter (Signed)
PLEASE NOTE: All timestamps contained within this report are represented as [place] Standard Time. CONFIDENTIALTY NOTICE: This fax transmission is intended only for the addressee. It contains information that is legally privileged, confidential or otherwise protected from use or disclosure. If you are not the intended recipient, you are strictly prohibited from reviewing, disclosing, copying using or disseminating any of this information or taking any action in reliance on or regarding this information. If you have received this fax in error, please notify us immediately by telephone so that we can arrange for its return to [place]. Phone: [phone], Toll-Free: [phone], Fax: [phone] Page: 1 of 2 Call Id: [id number] [place] Patient Name: Theresa Huber Gender: Female DOB: Mar 29, 1951 Age: 65 Y 77 M 6 D Return Phone Number: [phone] (Primary) Address: City/State/Zip: [place] Client [facility] - Client Client Site [place] Physician Viviana Simpler Contact Type Call Who Is Calling Patient / Member / Family / Caregiver Call Type Triage / Clinical Caller Name Tara Relationship To Patient Self Return Phone Number [phone] (Primary) Chief Complaint Rash - Localized Reason for Call Symptomatic / Request for [place] states she has broken in rash on her neck - thinks it might shingles PreDisposition Go to ED Translation No Nurse Assessment Nurse: Mills-Hernandez, RN, Izora Gala Date/Time (Eastern Time): 03/10/2016 3:49:23 PM Confirm and document reason for call. If symptomatic, describe symptoms. You must click the next button to save text entered. ---Caller states that Thursday night she started breaking out in a rash on her neck. The rash has gotten worse. Went to pharmacy and pharmacist  told her that it may be shingles as it didn't look like poison ivy rash. Rash is very itchy. Also has 3 small blisters on her right hand. Has the patient traveled out of the country within the last 30 days? ---No Does the patient have any new or worsening symptoms? ---Yes Will a triage be completed? ---Yes Related visit to physician within the last 2 weeks? ---No Does the PT have any chronic conditions? (i.e. diabetes, asthma, etc.) ---Yes List chronic conditions. ---Acid Reflux Is this a behavioral health or substance abuse call? ---No Guidelines Guideline Title Affirmed Question Affirmed Notes Nurse Date/Time (Eastern Time) Blister - Foot and Hand [1] Cause unknown AND [2] new blisters are developing Mills-Hernandez, RN, Izora Gala 03/10/2016 3:52:24 PM Shingles [1] Shingles rash AND [2] onset > 72 hours ago Mills-Hernandez, RN, Izora Gala 03/10/2016 3:56:08 PM PLEASE NOTE: All timestamps contained within this report are represented as [place] Standard Time. CONFIDENTIALTY NOTICE: This fax transmission is intended only for the addressee. It contains information that is legally privileged, confidential or otherwise protected from use or disclosure. If you are not the intended recipient, you are strictly prohibited from reviewing, disclosing, copying using or disseminating any of this information or taking any action in reliance on or regarding this information. If you have received this fax in error, please notify us immediately by telephone so that we can arrange for its return to [place]. Phone: [phone], Toll-Free: [phone], Fax: [phone] Page: 2 of 2 Call Id: [id number] [place]. Time Eilene Ghazi Time) Disposition Final User 03/10/2016 3:54:29 PM See Physician within 24 Hours Yes Mills-Hernandez, RN, Cindee Salt Understands: Yes Disagree/Comply: Comply Caller Understands: Yes Disagree/Comply: Comply Care Advice Given Per Guideline SEE PHYSICIAN WITHIN 24 HOURS: * IF OFFICE WILL BE  OPEN: You need to be seen within  the next 24 hours. Call your doctor when the office opens, and make an appointment. CALL BACK IF: * Fever occurs * You become worse. CARE ADVICE given per Blisters - Foot or Hand (Adult) guideline. SEE PHYSICIAN WITHIN 24 HOURS: PAIN MEDICINES: * For pain relief, take acetaminophen, ibuprofen, or naproxen. * Use the lowest amount that makes your pain feel better. ACETAMINOPHEN (E.G., TYLENOL): * Take 650 mg (two 325 mg pills) by mouth every 4-6 hours as needed. Each Regular Strength Tylenol pill has 325 mg of acetaminophen. The most you should take each day is 3,250 mg (10 Regular Strength pills a day). IBUPROFEN (E.G., MOTRIN, ADVIL): * Take 400 mg (two 200 mg pills) by mouth every 6 hours as needed. * Do not take NSAID medications for over 7 days without consulting your PCP. * Do not take NSAID medications if you are pregnant. * GASTROINTESTINAL RISK: There is an increased risk of stomach ulcers, GI bleeding, perforation. * CARDIOVASCULAR RISK: There may be an increased risk of heart attack and stroke. * Do not take nonsteroidal anti-inflammatory drugs (NSAIDs) if you have stomach problems, kidney disease, heart failure, or other contraindications to using this type of medication. CAUTION - NSAIDS (E.G., IBUPROFEN, NAPROXEN): DON'T SCRATCH: * Try not to scratch. * Itching is often worsened by scratching (the 'Itch-Scratch' cycle). CONTAGIOUSNESS: * The shingles sores contain the chickenpox virus. So while others cannot get shingles from you, it is possible that they can get chickenpox (if they never had it before). * Avoid contact with pregnant women. Avoid contact with anyone who has a weakened immune system (immunocompromised) (e.g., HIV positive, cancer chemotherapy, chronic steroid treatment, splenectomy, organ transplant). CALL BACK IF: * Fever over 100.5 F (38.1 C) * You become worse. CARE ADVICE given per Shingles (Adult) guideline. Referrals REFERRED TO  PCP OFFICE REFERRED TO PCP OFFICE

## 2016-03-11 NOTE — Progress Notes (Signed)
Subjective:    Patient ID: Theresa Huber, female    DOB: Mar 20, 1951, 65 y.o.   MRN: [id number]  HPI  Theresa Huber is a 65 year old female who presents today with a chief complaint of rash. Her rash is located to her anterior neck and right hand and has been present since Thursday night. Her rash is itchy in nature with occasional burning. She was in the garden Thursday afternoon. She's been using Benadryl and Hydrocortosone cream without improvement. She presented to a pharmacist who believed her rash to be shingles. She completed the Zostavax vaccination in 2014.   Review of Systems  Constitutional: Negative for fever.  Skin: Positive for rash.  Neurological: Negative for numbness.       Past Medical History  Diagnosis Date  . GERD (gastroesophageal reflux disease)   . IBS (irritable bowel syndrome)   . HLD (hyperlipidemia)   . Osteoporosis     T score -2.5 at hip in 2008     Social History   Social History  . Marital Status: Married    Spouse Name: N/A  . Number of Children: 1  . Years of Education: N/A   Occupational History  . Receiving [employer]   Social History Main Topics  . Smoking status: Former Smoker -- 2.00 packs/day for 8 years    Types: Cigarettes    Quit date: 11/18/1984  . Smokeless tobacco: Never Used  . Alcohol Use: No  . Drug Use: No  . Sexual Activity: Not on file   Other Topics Concern  . Not on file   Social History Narrative    Past Surgical History  Procedure Laterality Date  . Cesarean section  1983  . Abdominal hysterectomy    . Knee arthroscopy  1990    LEFT  . Lipoma excision  1980    LEFT THIGH    Family History  Problem Relation Age of Onset  . Diabetes Mother   . Heart disease Mother     ? A FIB  . Cancer Mother     ? sCHWANOMMA, SKIN CANCER  . Sleep apnea Mother   . Dementia Mother 32    Alzheimer's  . COPD Father   . Hyperlipidemia Father   . Sleep apnea Father   . Emphysema Father   . Mitral valve  prolapse Sister   . Cancer Sister     MELANOMA    Allergies  Allergen Reactions  . Shellfish Allergy Hives and Itching  . Ibandronate Sodium     REACTION: bad chest pain  . Sulfonamide Derivatives     REACTION: hives    Current Outpatient Prescriptions on File Prior to Visit  Medication Sig Dispense Refill  . Calcium Carbonate-Vitamin D (CALTRATE 600+D) 600-400 MG-UNIT per tablet Take 1 tablet by mouth 2 (two) times daily.      . pantoprazole (PROTONIX) 40 MG tablet TAKE 1 TABLET BY MOUTH TWO TIMES DAILY 630 tablet 0  . metoprolol tartrate (LOPRESSOR) 25 MG tablet Take 0.5 tablets (12.5 mg total) by mouth 2 (two) times daily as needed (for palpitations.). (Patient not taking: Reported on 03/11/2016) 30 tablet 1   No current facility-administered medications on file prior to visit.    BP 124/84 mmHg  Pulse 80  Temp(Src) 98.2 F (36.8 C) (Oral)  Ht 5\' 2"  (1.575 m)  Wt 168 lb 1.9 oz (76.259 kg)  BMI 30.74 kg/m2  SpO2 97%    Objective:   Physical Exam  Constitutional: She appears  well-nourished.  Cardiovascular: Normal rate and regular rhythm.   Pulmonary/Chest: Effort normal and breath sounds normal.  Skin: Skin is warm.  Moderate erythema with few open and weeping sores to neck. Few small whelps to right hand. Both rashes representative of contact dermatitis          Assessment & Plan:  Poison Ivy/Oak Dermatitis:  Rash to neck and right hand since Thursday night after gardening all day. Moderate rash to neck, mild to right hand. Very itchy with some burning. Does not appear to be shingles as it does not follow a specific dermatome and is also located to right hand. RX for slow prednisone taper due to moderate intensity of dermatitis. RX for low dose triamcinolone cream to use BID for itching. Discussed not to use on face. Also provided information for home treatment options. Follow up PRN.

## 2016-03-11 NOTE — Telephone Encounter (Signed)
Patient is currently in the clinic for evaluation of symptoms.

## 2016-03-15 ENCOUNTER — Emergency Department
Admission: EM | Admit: 2016-03-15 | Discharge: 2016-03-15 | Disposition: A | Discharge status: Home / Self Care | Payer: 59 | Attending: Student | Admitting: Student

## 2016-03-15 ENCOUNTER — Encounter: Payer: Self-pay | Admitting: Emergency Medicine

## 2016-03-15 DIAGNOSIS — E785 Hyperlipidemia, unspecified: Secondary | ICD-10-CM | POA: Insufficient documentation

## 2016-03-15 DIAGNOSIS — M810 Age-related osteoporosis without current pathological fracture: Secondary | ICD-10-CM | POA: Diagnosis not present

## 2016-03-15 DIAGNOSIS — L237 Allergic contact dermatitis due to plants, except food: Secondary | ICD-10-CM | POA: Insufficient documentation

## 2016-03-15 DIAGNOSIS — R2232 Localized swelling, mass and lump, left upper limb: Secondary | ICD-10-CM | POA: Diagnosis present

## 2016-03-15 DIAGNOSIS — Z87891 Personal history of nicotine dependence: Secondary | ICD-10-CM | POA: Insufficient documentation

## 2016-03-15 MED ORDER — CYPROHEPTADINE HCL 4 MG PO TABS: 4.0000 mg | ORAL_TABLET | Freq: Three times a day (TID) | ORAL | Status: DC | PRN

## 2016-03-15 MED ORDER — RANITIDINE HCL 150 MG PO TABS
150.0000 mg | ORAL_TABLET | Freq: Two times a day (BID) | ORAL | Status: DC
Start: 2016-03-15 — End: 2016-07-01

## 2016-03-15 MED ORDER — FAMOTIDINE 20 MG PO TABS
40.0000 mg | ORAL_TABLET | Freq: Once | ORAL | Status: AC
  Administered 2016-03-15: 40 mg via ORAL
  Filled 2016-03-15: qty 2

## 2016-03-15 MED ORDER — DEXAMETHASONE SODIUM PHOSPHATE 10 MG/ML IJ SOLN
10.0000 mg | Freq: Once | INTRAMUSCULAR | Status: AC
  Administered 2016-03-15: 10 mg via INTRAMUSCULAR
  Filled 2016-03-15: qty 1

## 2016-03-15 NOTE — Discharge Instructions (Signed)
Contact Dermatitis Dermatitis is redness, soreness, and swelling (inflammation) of the skin. Contact dermatitis is a reaction to certain substances that touch the skin. You either touched something that irritated your skin, or you have allergies to something you touched.  HOME CARE  Skin Care  Moisturize your skin as needed.  Apply cool compresses to the affected areas.   Try taking a bath with:   Epsom salts. Follow the instructions on the package. You can get these at a pharmacy or grocery store.   Baking soda. Pour a small amount into the bath as told by your doctor.   Colloidal oatmeal. Follow the instructions on the package. You can get this at a pharmacy or grocery store.   Try applying baking soda paste to your skin. Stir water into baking soda until it looks like paste.  Do not scratch your skin.   Bathe less often.  Bathe in lukewarm water. Avoid using hot water.  Medicines  Take or apply over-the-counter and prescription medicines only as told by your doctor.   If you were prescribed an antibiotic medicine, take or apply your antibiotic as told by your doctor. Do not stop taking the antibiotic even if your condition starts to get better. General Instructions  Keep all follow-up visits as told by your doctor. This is important.   Avoid the substance that caused your reaction. If you do not know what caused it, keep a journal to try to track what caused it. Write down:   What you eat.   What cosmetic products you use.   What you drink.   What you wear in the affected area. This includes jewelry.   If you were given a bandage (dressing), take care of it as told by your doctor. This includes when to change and remove it.  GET HELP IF:   You do not get better with treatment.   Your condition gets worse.   You have signs of infection such as:  Swelling.  Tenderness.  Redness.  Soreness.  Warmth.   You have a fever.   You have new  symptoms.  GET HELP RIGHT AWAY IF:   You have a very bad headache.  You have neck pain.  Your neck is stiff.   You throw up (vomit).   You feel very sleepy.   You see red streaks coming from the affected area.   Your bone or joint underneath the affected area becomes painful after the skin has healed.   The affected area turns darker.   You have trouble breathing.    This information is not intended to replace advice given to you by your health care provider. Make sure you discuss any questions you have with your health care provider.   Document Released: 09/01/2009 Document Revised: 07/26/2015 Document Reviewed: 03/22/2015 Elsevier Interactive Patient Education 2016 Theresa Huber is a inflammation of the skin (contact dermatitis) caused by touching the allergens on the leaves of the Huber plant following previous exposure to the plant. The rash usually appears 48 hours after exposure. The rash is usually bumps (papules) or blisters (vesicles) in a linear pattern. Depending on your own sensitivity, the rash may simply cause redness and itching, or it may also progress to blisters which may break open. These must be well cared for to prevent secondary bacterial (germ) infection, followed by scarring. Keep any open areas dry, clean, dressed, and covered with an antibacterial ointment if needed. The eyes may also get  puffy. The puffiness is worst in the morning and gets better as the day progresses. This dermatitis usually heals without scarring, within 2 to 3 weeks without treatment. HOME CARE INSTRUCTIONS  Thoroughly wash with soap and water as soon as you have been exposed to [name]. You have about one half hour to remove the plant resin before it will cause the rash. This washing will destroy the oil or antigen on the skin that is causing, or will cause, the rash. Be sure to wash under your fingernails as any plant resin there will continue to spread  the rash. Do not rub skin vigorously when washing affected area. [name] cannot spread if no oil from the plant remains on your body. A rash that has progressed to weeping sores will not spread the rash unless you have not washed thoroughly. It is also important to wash any clothes you have been wearing as these may carry active allergens. The rash will return if you wear the unwashed clothing, even several days later. Avoidance of the plant in the future is the best measure. [name] plant can be recognized by the number of leaves. Generally, [name] has three leaves with flowering branches on a single stem. Diphenhydramine may be purchased over the counter and used as needed for itching. Do not drive with this medication if it makes you drowsy.Ask your caregiver about medication for children. SEEK MEDICAL CARE IF:  Open sores develop.  Redness spreads beyond area of rash.  You notice purulent (pus-like) discharge.  You have increased pain.  Other signs of infection develop (such as fever).   This information is not intended to replace advice given to you by your health care provider. Make sure you discuss any questions you have with your health care provider.   Document Released: 11/01/2000 Document Revised: 01/27/2012 Document Reviewed: 04/12/2015 Elsevier Interactive Patient Education 2016 Theresa Huber should continue to dose the previously prescribed steroid as directed. Take the antihistamine medicines as directed. Apply cool compresses to reduce swelling. Follow-up with your provider as needed.

## 2016-03-15 NOTE — ED Provider Notes (Signed)
[facility] Emergency Department Provider Note ____________________________________________  Time seen: 0822  I have reviewed the triage vital signs and the nursing notes.  HISTORY  Chief Complaint  Allergic Reaction  HPI Theresa Huber is a 65 y.o. female presents to the ED for evaluation of swelling to the dorsal aspect of the left hand and forearm yesterday. She describes she is on the current regimen of prednisone and topical triamcinolone cream for poison ivy exposure from last week. She reports that yesterday she was outside wearing gloves, using a Roundup brand of weed killer totake down the remaining poison ivy. She noted later on yesterday the onset of some burning and itching to the dorsal aspect of the left forearm. Today she weighs with swelling noted to the dorsal forearm. She denies previous poison ivy lesions to the left upper extremity. She also denies any respiratory distress, or any swelling, of the mouth, lips, tongue or throat.  Past Medical History  Diagnosis Date  . GERD (gastroesophageal reflux disease)   . IBS (irritable bowel syndrome)   . HLD (hyperlipidemia)   . Osteoporosis     T score -2.5 at hip in 2008    Patient Active Problem List   Diagnosis Date Noted  . Palpitations 02/13/2016  . Routine general medical examination at a health care facility 03/17/2012  . ACTINIC KERATOSIS 08/14/2010  . OSTEOPOROSIS 04/14/2008  . Hyperlipemia 06/08/2007  . GERD 06/08/2007    Past Surgical History  Procedure Laterality Date  . Cesarean section  1983  . Abdominal hysterectomy    . Knee arthroscopy  1990    LEFT  . Lipoma excision  1980    LEFT THIGH    Current Outpatient Rx  Name  Route  Sig  Dispense  Refill  . Calcium Carbonate-Vitamin D (CALTRATE 600+D) 600-400 MG-UNIT per tablet   Oral   Take 1 tablet by mouth 2 (two) times daily.           . cyproheptadine (PERIACTIN) 4 MG tablet   Oral   Take 1 tablet (4 mg total)  by mouth 3 (three) times daily as needed for allergies.   30 tablet   0   . metoprolol tartrate (LOPRESSOR) 25 MG tablet   Oral   Take 0.5 tablets (12.5 mg total) by mouth 2 (two) times daily as needed (for palpitations.). Patient not taking: Reported on 03/11/2016   30 tablet   1   . pantoprazole (PROTONIX) 40 MG tablet      TAKE 1 TABLET BY MOUTH TWO TIMES DAILY   630 tablet   0   . predniSONE (DELTASONE) 20 MG tablet      Take 3 tablets for 3 days, then 2 tablets for 3 days, then 1 tablet for 3 days, then 1/2 tablet for 3 days.   20 tablet   0   . ranitidine (ZANTAC) 150 MG tablet   Oral   Take 1 tablet (150 mg total) by mouth 2 (two) times daily.   20 tablet   0   . triamcinolone cream (KENALOG) 0.1 %   Topical   Apply 1 application topically 2 (two) times daily.   30 g   0    Allergies Shellfish allergy; Ibandronate sodium; and Sulfonamide derivatives  Family History  Problem Relation Age of Onset  . Diabetes Mother   . Heart disease Mother     ? A FIB  . Cancer Mother     ? sCHWANOMMA, SKIN CANCER  .  Sleep apnea Mother   . Dementia Mother 59    Alzheimer's  . COPD Father   . Hyperlipidemia Father   . Sleep apnea Father   . Emphysema Father   . Mitral valve prolapse Sister   . Cancer Sister     MELANOMA   Social History Social History  Substance Use Topics  . Smoking status: Former Smoker -- 2.00 packs/day for 8 years    Types: Cigarettes    Quit date: 11/18/1984  . Smokeless tobacco: Never Used  . Alcohol Use: No   Review of Systems  Constitutional: Negative for fever. Cardiovascular: Negative for chest pain. Respiratory: Negative for shortness of breath. Gastrointestinal: Negative for abdominal pain, vomiting and diarrhea. Skin: Positive for rash. Local edema to the dorsal left forearm.  Neurological: Negative for headaches, focal weakness or numbness. ____________________________________________  PHYSICAL EXAM:  VITAL SIGNS: ED  Triage Vitals  Enc Vitals Group     BP 03/15/16 0743 154/75 mmHg     Pulse Rate 03/15/16 0743 91     Resp 03/15/16 0743 18     Temp 03/15/16 0743 98 F (36.7 C)     Temp Source 03/15/16 0743 Oral     SpO2 03/15/16 0743 96 %     Weight 03/15/16 0743 168 lb (76.204 kg)     Height 03/15/16 0743 5\' 1"  (1.549 m)     Head Cir --      Peak Flow --      Pain Score --      Pain Loc --      Pain Edu? --      Excl. in [place]? --    Constitutional: Alert and oriented. Well appearing and in no distress. Head: Normocephalic and atraumatic. Hematological/Lymphatic/Immunological: No epitrochlear lymphadenopathy. Cardiovascular: Normal rate, regular rhythm.  Respiratory: Normal respiratory effort. No wheezes/rales/rhonchi. Musculoskeletal: Nontender with normal range of motion in all extremities.  Neurologic:  Normal gait without ataxia. Normal speech and language. No gross focal neurologic deficits are appreciated. Skin:  Skin is warm, dry and intact. Patient with local erythema and irritation noted to the dorsal left arm. Stable psoriatic changes to the cuticles bilaterally.  ____________________________________________  PROCEDURES  Decadron 10 mg IM Famotidine 40 mg PO ____________________________________________  INITIAL IMPRESSION / ASSESSMENT AND PLAN / ED COURSE  Patient with a local contact dermatitis which may be chemical in nature. Patient will be discharged with prescriptions for Periactin and ranitidine to dose as directed. She will continue to dose her prednisone and topical tonsil as presented directed. She should follow with primary care provider or return to the ED for respiratory distress. ____________________________________________  FINAL CLINICAL IMPRESSION(S) / ED DIAGNOSES  Final diagnoses:  Poison ivy dermatitis      Melvenia Needles, PA-C 03/15/16 1538  Joanne Gavel, MD 03/15/16 [phone]

## 2016-03-15 NOTE — ED Notes (Addendum)
Reports using cream for poison ivy and now having swelling in arms.  No sob.

## 2016-03-15 NOTE — ED Notes (Signed)
See triage note   States she developed some poison ivy about 1 week ago  Placed on prednisone and cream  States she was getting better   Then she was using a sprayer yesterday to kill some poison  And felt a burning to left hand  Left hand swollen and to wrist area   Positive itching  No resp distress noted

## 2016-07-01 ENCOUNTER — Emergency Department
Admission: EM | Admit: 2016-07-01 | Discharge: 2016-07-01 | Disposition: A | Discharge status: Home / Self Care | Payer: 59 | Attending: Emergency Medicine | Admitting: Emergency Medicine

## 2016-07-01 ENCOUNTER — Encounter: Payer: Self-pay | Admitting: Emergency Medicine

## 2016-07-01 ENCOUNTER — Emergency Department: Payer: 59

## 2016-07-01 DIAGNOSIS — Z7982 Long term (current) use of aspirin: Secondary | ICD-10-CM | POA: Insufficient documentation

## 2016-07-01 DIAGNOSIS — Z79899 Other long term (current) drug therapy: Secondary | ICD-10-CM | POA: Insufficient documentation

## 2016-07-01 DIAGNOSIS — R079 Chest pain, unspecified: Secondary | ICD-10-CM | POA: Diagnosis not present

## 2016-07-01 DIAGNOSIS — Z87891 Personal history of nicotine dependence: Secondary | ICD-10-CM | POA: Insufficient documentation

## 2016-07-01 LAB — CBC
HCT: 39.9 % (ref 35.0–47.0)
HEMOGLOBIN: 13.5 g/dL (ref 12.0–16.0)
MCH: 28.5 pg (ref 26.0–34.0)
MCHC: 33.9 g/dL (ref 32.0–36.0)
MCV: 84.1 fL (ref 80.0–100.0)
Platelets: 182 10*3/uL (ref 150–440)
RBC: 4.74 MIL/uL (ref 3.80–5.20)
RDW: 14.6 % — ABNORMAL HIGH (ref 11.5–14.5)
WBC: 8.5 10*3/uL (ref 3.6–11.0)

## 2016-07-01 LAB — BASIC METABOLIC PANEL
ANION GAP: 7 (ref 5–15)
BUN: 24 mg/dL — ABNORMAL HIGH (ref 6–20)
CALCIUM: 8.5 mg/dL — AB (ref 8.9–10.3)
CO2: 25 mmol/L (ref 22–32)
Chloride: 108 mmol/L (ref 101–111)
Creatinine, Ser: 0.68 mg/dL (ref 0.44–1.00)
GLUCOSE: 123 mg/dL — AB (ref 65–99)
Potassium: 3.8 mmol/L (ref 3.5–5.1)
SODIUM: 140 mmol/L (ref 135–145)

## 2016-07-01 LAB — TROPONIN I

## 2016-07-01 LAB — FIBRIN DERIVATIVES D-DIMER (ARMC ONLY): FIBRIN DERIVATIVES D-DIMER (ARMC): 237 (ref 0–499)

## 2016-07-01 NOTE — ED Notes (Signed)
Pt denies increase in pain with inspiration or movement. Pt states pain at worst is 7/10. Pt with pwd skin, unlabored resps.

## 2016-07-01 NOTE — ED Notes (Signed)
Report to kim, rn.  

## 2016-07-01 NOTE — ED Provider Notes (Signed)
[facility] Emergency Department Provider Note _________   None    (approximate)  I have reviewed the triage vital signs and the nursing notes.   HISTORY  Chief Complaint Chest Pain    HPI Theresa Huber is a 65 y.o. female resents with acute onset of nonradiating chest pain at 11 PM last night. Patient states she went to bed however awoke with the pain this morning which has since resolved. Patient denies any dyspnea no diaphoresis no dizziness no nausea or vomiting. Patient denies any lower extremity pain swelling. Patient denies any history of DVT or PE. Patient denies any personal or familial history of MI.   Past Medical History:  Diagnosis Date  . GERD (gastroesophageal reflux disease)   . IBS (irritable bowel syndrome)   . Osteoporosis    T score -2.5 at hip in 2008    Patient Active Problem List   Diagnosis Date Noted  . Palpitations 02/13/2016  . Routine general medical examination at a health care facility 03/17/2012  . ACTINIC KERATOSIS 08/14/2010  . OSTEOPOROSIS 04/14/2008  . Hyperlipemia 06/08/2007  . GERD 06/08/2007    Past Surgical History:  Procedure Laterality Date  . ABDOMINAL HYSTERECTOMY    . CESAREAN SECTION  1983  . KNEE ARTHROSCOPY  1990   LEFT  . LIPOMA EXCISION  1980   LEFT THIGH    Prior to Admission medications   Medication Sig Start Date End Date Taking? Authorizing Provider  Calcium Carbonate-Vitamin D (CALTRATE 600+D) 600-400 MG-UNIT per tablet Take 1 tablet by mouth 2 (two) times daily.      Historical Provider, MD  cyproheptadine (PERIACTIN) 4 MG tablet Take 1 tablet (4 mg total) by mouth 3 (three) times daily as needed for allergies. 03/15/16   Jenise V Bacon Menshew, PA-C  metoprolol tartrate (LOPRESSOR) 25 MG tablet Take 0.5 tablets (12.5 mg total) by mouth 2 (two) times daily as needed (for palpitations.). Patient not taking: Reported on 03/11/2016 02/12/16   Tonia Ghent, MD  pantoprazole  (PROTONIX) 40 MG tablet TAKE 1 TABLET BY MOUTH TWO TIMES DAILY 01/26/16   Venia Carbon, MD  predniSONE (DELTASONE) 20 MG tablet Take 3 tablets for 3 days, then 2 tablets for 3 days, then 1 tablet for 3 days, then 1/2 tablet for 3 days. 03/11/16   Pleas Koch, NP  ranitidine (ZANTAC) 150 MG tablet Take 1 tablet (150 mg total) by mouth 2 (two) times daily. 03/15/16   Jenise V Bacon Menshew, PA-C  triamcinolone cream (KENALOG) 0.1 % Apply 1 application topically 2 (two) times daily. 03/11/16   Pleas Koch, NP    Allergies Shellfish allergy; Ibandronate sodium; and Sulfonamide derivatives  Family History  Problem Relation Age of Onset  . Diabetes Mother   . Heart disease Mother     ? A FIB  . Cancer Mother     ? sCHWANOMMA, SKIN CANCER  . Sleep apnea Mother   . Dementia Mother 42    Alzheimer's  . COPD Father   . Hyperlipidemia Father   . Sleep apnea Father   . Emphysema Father   . Mitral valve prolapse Sister   . Cancer Sister     MELANOMA    Social History Social History  Substance Use Topics  . Smoking status: Former Smoker    Packs/day: 2.00    Years: 8.00    Types: Cigarettes    Quit date: 11/18/1984  . Smokeless tobacco: Never Used  . Alcohol  use No    Review of Systems Constitutional: No fever/chills Eyes: No visual changes. ENT: No sore throat. Cardiovascular:Positive for chest pain. Respiratory: Denies shortness of breath. Gastrointestinal: No abdominal pain.  No nausea, no vomiting.  No diarrhea.  No constipation. Genitourinary: Negative for dysuria. Musculoskeletal: Negative for back pain. Skin: Negative for rash. Neurological: Negative for headaches, focal weakness or numbness.  10-point ROS otherwise negative.  ____________________________________________   PHYSICAL EXAM:  VITAL SIGNS: ED Triage Vitals  Enc Vitals Group     BP 07/01/16 0549 (!) 153/84     Pulse Rate 07/01/16 0549 77     Resp 07/01/16 0549 20     Temp 07/01/16 0549  97.8 F (36.6 C)     Temp Source 07/01/16 0549 Oral     SpO2 07/01/16 0549 99 %     Weight 07/01/16 0542 162 lb (73.5 kg)     Height 07/01/16 0542 5\' 1"  (1.549 m)     Head Circumference --      Peak Flow --      Pain Score 07/01/16 0543 7     Pain Loc --      Pain Edu? --      Excl. in [place]? --     Constitutional: Alert and oriented. Well appearing and in no acute distress. Eyes: Conjunctivae are normal. PERRL. EOMI. Head: Atraumatic. Mouth/Throat: Mucous membranes are moist.  Oropharynx non-erythematous. Neck: No stridor.  No meningeal signs.   Cardiovascular: Normal rate, regular rhythm. Good peripheral circulation. Grossly normal heart sounds.   Respiratory: Normal respiratory effort.  No retractions. Lungs CTAB. Gastrointestinal: Soft and nontender. No distention.  Musculoskeletal: No lower extremity tenderness nor edema. No gross deformities of extremities. Neurologic:  Normal speech and language. No gross focal neurologic deficits are appreciated.  Skin:  Skin is warm, dry and intact. No rash noted. Psychiatric: Mood and affect are normal. Speech and behavior are normal.  ____________________________________________   LABS (all labs ordered are listed, but only abnormal results are displayed)  Labs Reviewed  BASIC METABOLIC PANEL - Abnormal; Notable for the following:       Result Value   Glucose, Bld 123 (*)    BUN 24 (*)    Calcium 8.5 (*)    All other components within normal limits  CBC - Abnormal; Notable for the following:    RDW 14.6 (*)    All other components within normal limits  TROPONIN I  FIBRIN DERIVATIVES D-DIMER ([facility] ONLY)   ____________________________________________  EKG  ED ECG REPORT I, Arbutus N BROWN, the attending physician, personally viewed and interpreted this ECG.   Date: 07/01/2016  EKG Time: 5:45 AM  Rate: 78  Rhythm: Normal sinus rhythm  Axis: Normal  Intervals: Normal  ST&T Change:  None  ____________________________________________  RADIOLOGY I, Bondurant N BROWN, personally viewed and evaluated these images (plain radiographs) as part of my medical decision making, as well as reviewing the written report by the radiologist.  Dg Chest 2 View  Result Date: 07/01/2016 CLINICAL DATA:  Initial evaluation for acute chest pain. EXAM: CHEST  2 VIEW COMPARISON:  Prior radiograph from 06/07/2007. FINDINGS: The cardiac and mediastinal silhouettes are stable in size and contour, and remain within normal limits. The lungs are normally inflated. No airspace consolidation, pleural effusion, or pulmonary edema is identified. There is no pneumothorax. No acute osseous abnormality identified. IMPRESSION: No active cardiopulmonary disease. Electronically Signed   By: Jeannine Boga M.D.   On: 07/01/2016 06:27  Procedures    INITIAL IMPRESSION / ASSESSMENT AND PLAN / ED COURSE  Pertinent labs & imaging results that were available during my care of the patient were reviewed by me and considered in my medical decision making (see chart for details).  EKG revealed no evidence of ischemia cardiac enzymes negative 1. Patient's care transferred to Dr. Jimmye Norman awaiting repeat troponin level as well as d-dimer.  Clinical Course    ____________________________________________  FINAL CLINICAL IMPRESSION(S) / ED DIAGNOSES  Final diagnoses:  None  Chest pain   MEDICATIONS GIVEN DURING THIS VISIT:  Medications - No data to display   NEW OUTPATIENT MEDICATIONS STARTED DURING THIS VISIT:  New Prescriptions   No medications on file      Note:  This document was prepared using Dragon voice recognition software and may include unintentional dictation errors.    Gregor Hams, MD 07/01/16 [phone]

## 2016-07-01 NOTE — ED Provider Notes (Signed)
Repeat troponin is negative, per Dr. Saul Fordyce plan, she is stable for discharge.   Earleen Newport, MD 07/01/16 1044

## 2016-07-01 NOTE — ED Triage Notes (Signed)
Pt reports pain to the center of her chest since 11pm last night; was able to sleep but woke with the pain this am; pain in intermittently stabbing pain, worse with movement; nausea, no vomiting; no shortness of breath;

## 2016-07-08 ENCOUNTER — Telehealth: Payer: Self-pay

## 2016-07-08 NOTE — Telephone Encounter (Signed)
Spoke to pt. She has an appt tomorrow.

## 2016-07-08 NOTE — Telephone Encounter (Signed)
PLEASE NOTE: All timestamps contained within this report are represented as [place] Standard Time. CONFIDENTIALTY NOTICE: This fax transmission is intended only for the addressee. It contains information that is legally privileged, confidential or otherwise protected from use or disclosure. If you are not the intended recipient, you are strictly prohibited from reviewing, disclosing, copying using or disseminating any of this information or taking any action in reliance on or regarding this information. If you have received this fax in error, please notify us immediately by telephone so that we can arrange for its return to [place]. Phone: [phone], Toll-Free: [phone], Fax: [phone] Page: 1 of 2 Call Id: [id number] [place] Patient Name: Theresa Huber Gender: Female DOB: [birth year] Age: 65 Y 10 M 2 D Return Phone Number: [phone] (Primary) Address: City/State/Zip: [place] Client [place] Night - Client Client Site [place] Physician Viviana Simpler - MD Contact Type Call Who Is Calling Patient / Member / Family / Caregiver Call Type Triage / Clinical Relationship To Patient Self Return Phone Number [phone] (Primary) Chief Complaint Back Pain - General Reason for Call Symptomatic / Request for Health Information Initial Comment She is having lower back pain. PreDisposition Home Care Translation No Nurse Assessment Nurse: Thad Ranger, RN, Langley Gauss Date/Time (Eastern Time): 07/06/2016 4:38:48 PM Confirm and document reason for call. If symptomatic, describe symptoms. You must click the next button to save text entered. ---She is having lower back pain. At work on [employer] and had back spasms. Cont working and back pain worsened. Taking Alleve but not helping pain. Has the patient traveled out of the country within the last  30 days? ---Not Applicable Does the patient have any new or worsening symptoms? ---Yes Will a triage be completed? ---Yes Related visit to physician within the last 2 weeks? ---No Does the PT have any chronic conditions? (i.e. diabetes, asthma, etc.) ---No Is this a behavioral health or substance abuse call? ---No Guidelines Guideline Title Affirmed Question Affirmed Notes Nurse Date/Time (Eastern Time) Back Injury [1] SEVERE pain (e.g., excruciating) AND [2] not improved 2 hours after pain medicine/ice packs Carmon, RN, Langley Gauss 07/06/2016 4:40:20 PM Disp. Time Eilene Ghazi Time) Disposition Final User 07/06/2016 4:44:11 PM See Physician within 4 Hours (or PCP triage) Yes Carmon, RN, Langley Gauss PLEASE NOTE: All timestamps contained within this report are represented as [place] Standard Time. CONFIDENTIALTY NOTICE: This fax transmission is intended only for the addressee. It contains information that is legally privileged, confidential or otherwise protected from use or disclosure. If you are not the intended recipient, you are strictly prohibited from reviewing, disclosing, copying using or disseminating any of this information or taking any action in reliance on or regarding this information. If you have received this fax in error, please notify us immediately by telephone so that we can arrange for its return to [place]. Phone: [phone], Toll-Free: [phone], Fax: [phone] Page: 2 of 2 Call Id: [id number] Caller Understands: Yes Disagree/Comply: Disagree Disagree/Comply Reason: Wait and see Care Advice Given Per Guideline SEE PHYSICIAN WITHIN 4 HOURS (or PCP triage): PAIN MEDICINES: NAPROXEN (E.G., ALEVE): CALL BACK IF: * You become worse. CARE ADVICE given per Back Injury (Adult) guideline. Referrals GO TO FACILITY REFUSED

## 2016-07-08 NOTE — Telephone Encounter (Signed)
PLEASE NOTE: All timestamps contained within this report are represented as [place] Standard Time. CONFIDENTIALTY NOTICE: This fax transmission is intended only for the addressee. It contains information that is legally privileged, confidential or otherwise protected from use or disclosure. If you are not the intended recipient, you are strictly prohibited from reviewing, disclosing, copying using or disseminating any of this information or taking any action in reliance on or regarding this information. If you have received this fax in error, please notify us immediately by telephone so that we can arrange for its return to [place]. Phone: [phone], Toll-Free: [phone], Fax: [phone] Page: 1 of 1 Call Id: [id number] [place] Patient Name: Theresa Huber Gender: Female DOB: August 31, 1951 Age: 65 Y 10 M 2 D Return Phone Number: [phone] (Primary) Address[place] [address] Client [facility] - Client Client Site [place] - Day Physician Viviana Simpler - MD Contact Type Call Who Is Calling Patient / Member / Family / Caregiver Call Type Triage / Clinical Relationship To Patient Self Return Phone Number [phone] (Primary) Chief Complaint Back Pain - General Reason for Call Symptomatic / Request for [place] states she is having lower back pain. Appointment Disposition EMR Caller Not Reached Info pasted into Epic No Translation No Nurse Assessment Guidelines Guideline Title Affirmed Question Affirmed Notes Nurse Date/Time (Eastern Time) Disp. Time Eilene Ghazi Time) Disposition Final User 07/06/2016 11:01:49 AM Attempt made - no message left Jackqulyn Livings 07/06/2016 11:22:03 AM FINAL ATTEMPT MADE - no message left Yes Markus Daft, RN, Sherre Poot Comments User: Mayford Knife, RN  Date/Time Eilene Ghazi Time): 07/06/2016 11:02:08 AM automated msg states that pt is not accepting calls at this time.

## 2016-07-08 NOTE — Telephone Encounter (Signed)
Unable to reach pt by phone.

## 2016-07-08 NOTE — Telephone Encounter (Signed)
Please try again later

## 2016-07-09 ENCOUNTER — Encounter: Payer: Self-pay | Admitting: Internal Medicine

## 2016-07-09 ENCOUNTER — Ambulatory Visit (INDEPENDENT_AMBULATORY_CARE_PROVIDER_SITE_OTHER): Payer: 59 | Admitting: Internal Medicine

## 2016-07-09 DIAGNOSIS — S335XXA Sprain of ligaments of lumbar spine, initial encounter: Secondary | ICD-10-CM

## 2016-07-09 NOTE — Progress Notes (Signed)
Pre visit review using our clinic review tool, if applicable. No additional management support is needed unless otherwise documented below in the visit note. 

## 2016-07-09 NOTE — Assessment & Plan Note (Signed)
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

## 2016-07-09 NOTE — Progress Notes (Signed)
   Subjective:    Patient ID: Theresa Huber, female    DOB: 04-11-1951, 65 y.o.   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 Tried aleve--no help (even 2) Pain slightly better but not gone Afraid to go to work--lots of bending, lifting and hanging clothes  No radiation of pain No leg pain or weakness  "I have had a rough month"--- chest pain visit to ER, sent home This has resolved  Current Outpatient Prescriptions on File Prior to Visit  Medication Sig Dispense Refill  . aspirin EC 81 MG tablet Take 81 mg by mouth daily.    . pantoprazole (PROTONIX) 40 MG tablet TAKE 1 TABLET BY MOUTH TWO TIMES DAILY 630 tablet 0  . Probiotic Product (PROBIOTIC DAILY PO) Take 1 Dose by mouth daily.     No current facility-administered medications on file prior to visit.     Allergies  Allergen Reactions  . Shellfish Allergy Hives and Itching  . Ibandronate Sodium     REACTION: bad chest pain  . Sulfonamide Derivatives     REACTION: hives    Past Medical History:  Diagnosis Date  . GERD (gastroesophageal reflux disease)   . IBS (irritable bowel syndrome)   . Osteoporosis    T score -2.5 at hip in 2008    Past Surgical History:  Procedure Laterality Date  . ABDOMINAL HYSTERECTOMY    . CESAREAN SECTION  1983  . KNEE ARTHROSCOPY  1990   LEFT  . LIPOMA EXCISION  1980   LEFT THIGH    Family History  Problem Relation Age of Onset  . Diabetes Mother   . Heart disease Mother     ? A FIB  . Cancer Mother     ? sCHWANOMMA, SKIN CANCER  . Sleep apnea Mother   . Dementia Mother 42    Alzheimer's  . COPD Father   . Hyperlipidemia Father   . Sleep apnea Father   . Emphysema Father   . Mitral valve prolapse Sister   . Cancer Sister     MELANOMA    Social History   Social History  .  Marital status: Married    Spouse name: N/A  . Number of children: 1  . Years of education: N/A   Occupational History  . Receiving [employer]   Social History Main Topics  . Smoking status: Former Smoker    Packs/day: 2.00    Years: 8.00    Types: Cigarettes    Quit date: 11/18/1984  . Smokeless tobacco: Never Used  . Alcohol use No  . Drug use: No  . Sexual activity: Not on file   Other Topics Concern  . Not on file   Social History Narrative  . No narrative on file   Review of Systems Some constipation--no loss of control No urinary problems    Objective:   Physical Exam  Musculoskeletal:  No spine tenderness No back tenderness SLR negative bilaterally Normal ROM of hips Back flexion okay  Neurological:  Gait normal No weakness in legs          Assessment & Plan:

## 2016-08-09 ENCOUNTER — Encounter: Payer: 59 | Admitting: Internal Medicine

## 2016-08-13 ENCOUNTER — Encounter: Payer: 59 | Admitting: Internal Medicine

## 2016-08-28 ENCOUNTER — Ambulatory Visit (INDEPENDENT_AMBULATORY_CARE_PROVIDER_SITE_OTHER): Payer: 59 | Admitting: Internal Medicine

## 2016-08-28 ENCOUNTER — Encounter: Payer: Self-pay | Admitting: Internal Medicine

## 2016-08-28 VITALS — BP 112/84 | HR 84 | Temp 97.6°F | Ht 61.0 in | Wt 173.0 lb

## 2016-08-28 DIAGNOSIS — K219 Gastro-esophageal reflux disease without esophagitis: Secondary | ICD-10-CM

## 2016-08-28 DIAGNOSIS — Z23 Encounter for immunization: Secondary | ICD-10-CM | POA: Diagnosis not present

## 2016-08-28 DIAGNOSIS — R200 Anesthesia of skin: Secondary | ICD-10-CM | POA: Insufficient documentation

## 2016-08-28 DIAGNOSIS — Z0000 Encounter for general adult medical examination without abnormal findings: Secondary | ICD-10-CM

## 2016-08-28 LAB — GLUCOSE, RANDOM: Glucose, Bld: 103 mg/dL — ABNORMAL HIGH (ref 70–99)

## 2016-08-28 LAB — HEMOGLOBIN A1C: Hgb A1c MFr Bld: 5.7 % (ref 4.6–6.5)

## 2016-08-28 LAB — VITAMIN B12: VITAMIN B 12: 499 pg/mL (ref 211–911)

## 2016-08-28 NOTE — Progress Notes (Signed)
Subjective:    Patient ID: Theresa Huber, female    DOB: 29-Jun-1951, 65 y.o.   MRN: [id number]  HPI Here for physical  Disturbed by seeing mom going through Rew She is worried for herself Discussed exercise, social interaction, vascular risks  Worried that husband is having cognitive issues also It is even affecting her sleep  She has joined a gym Trying to exercise-- eat better Back is better  Current Outpatient Prescriptions on File Prior to Visit  Medication Sig Dispense Refill  . aspirin EC 81 MG tablet Take 81 mg by mouth daily.    . pantoprazole (PROTONIX) 40 MG tablet TAKE 1 TABLET BY MOUTH TWO TIMES DAILY 630 tablet 0  . Probiotic Product (PROBIOTIC DAILY PO) Take 1 Dose by mouth daily.     No current facility-administered medications on file prior to visit.     Allergies  Allergen Reactions  . Shellfish Allergy Hives and Itching  . Ibandronate Sodium     REACTION: bad chest pain  . Sulfonamide Derivatives     REACTION: hives    Past Medical History:  Diagnosis Date  . GERD (gastroesophageal reflux disease)   . IBS (irritable bowel syndrome)   . Osteoporosis    T score -2.5 at hip in 2008    Past Surgical History:  Procedure Laterality Date  . ABDOMINAL HYSTERECTOMY    . CESAREAN SECTION  1983  . KNEE ARTHROSCOPY  1990   LEFT  . LIPOMA EXCISION  1980   LEFT THIGH    Family History  Problem Relation Age of Onset  . Diabetes Mother   . Heart disease Mother     ? A FIB  . Cancer Mother     ? sCHWANOMMA, SKIN CANCER  . Sleep apnea Mother   . Dementia Mother 83    Alzheimer's  . COPD Father   . Hyperlipidemia Father   . Sleep apnea Father   . Emphysema Father   . Mitral valve prolapse Sister   . Cancer Sister     MELANOMA    Social History   Social History  . Marital status: Married    Spouse name: N/A  . Number of children: 1  . Years of education: N/A   Occupational History  . Receiving [employer]   Social History  Main Topics  . Smoking status: Former Smoker    Packs/day: 2.00    Years: 8.00    Types: Cigarettes    Quit date: 11/18/1984  . Smokeless tobacco: Never Used  . Alcohol use No  . Drug use: No  . Sexual activity: Not on file   Other Topics Concern  . Not on file   Social History Narrative  . No narrative on file   Review of Systems  Constitutional: Negative for fatigue and unexpected weight change.       Wears seat belt  HENT: Negative for dental problem, hearing loss and tinnitus.        Keeps up with dentist  Eyes: Negative for visual disturbance.       No diplopia or unilateral vision loss  Respiratory: Positive for cough. Negative for chest tightness and shortness of breath.   Cardiovascular: Negative for chest pain, palpitations and leg swelling.  Gastrointestinal: Negative for blood in stool, nausea and vomiting.       Acid symptoms controlled with protonix IBS--cramps/diarrhea/constipation----- but not frequent (if she eats fiber)  Endocrine: Negative for polydipsia and polyuria.  Genitourinary: Negative for difficulty urinating,  dyspareunia and hematuria.       No sex--no problems  Musculoskeletal: Negative for arthralgias, back pain and joint swelling.  Skin:       Some acne --considering going to dermatologist  Allergic/Immunologic: Negative for environmental allergies and immunocompromised state.  Neurological: Negative for dizziness, syncope and light-headedness.       No recent migraines Occasional burning sensation in toes  Hematological: Negative for adenopathy. Does not bruise/bleed easily.  Psychiatric/Behavioral: Negative for dysphoric mood. The patient is nervous/anxious.        Objective:   Physical Exam  Constitutional: She is oriented to person, place, and time. She appears well-developed and well-nourished. No distress.  HENT:  Head: Normocephalic and atraumatic.  Right Ear: External ear normal.  Left Ear: External ear normal.  Mouth/Throat:  Oropharynx is clear and moist. No oropharyngeal exudate.  Eyes: Conjunctivae are normal. Pupils are equal, round, and reactive to light.  Neck: Normal range of motion. Neck supple. No thyromegaly present.  Cardiovascular: Normal rate, regular rhythm, normal heart sounds and intact distal pulses.  Exam reveals no gallop.   No murmur heard. Pulmonary/Chest: Effort normal and breath sounds normal. No respiratory distress. She has no wheezes. She has no rales.  Abdominal: Soft. There is no tenderness.  Musculoskeletal: She exhibits no edema or tenderness.  Lymphadenopathy:    She has no cervical adenopathy.  Neurological: She is alert and oriented to person, place, and time.  Skin: No rash noted. No erythema.  Psychiatric: She has a normal mood and affect. Her behavior is normal.          Assessment & Plan:

## 2016-08-28 NOTE — Assessment & Plan Note (Signed)
Does okay with the medication 

## 2016-08-28 NOTE — Patient Instructions (Signed)
DASH Eating Plan  DASH stands for "Dietary Approaches to Stop Hypertension." The DASH eating plan is a healthy eating plan that has been shown to reduce high blood pressure (hypertension). Additional health benefits may include reducing the risk of type 2 diabetes mellitus, heart disease, and stroke. The DASH eating plan may also help with weight loss.  WHAT DO I NEED TO KNOW ABOUT THE DASH EATING PLAN?  For the DASH eating plan, you will follow these general guidelines:  · Choose foods with a percent daily value for sodium of less than 5% (as listed on the food label).  · Use salt-free seasonings or herbs instead of table salt or sea salt.  · Check with your health care provider or pharmacist before using salt substitutes.  · Eat lower-sodium products, often labeled as "lower sodium" or "no salt added."  · Eat fresh foods.  · Eat more vegetables, fruits, and low-fat dairy products.  · Choose whole grains. Look for the word "whole" as the first word in the ingredient list.  · Choose fish and skinless chicken or turkey more often than red meat. Limit fish, poultry, and meat to 6 oz (170 g) each day.  · Limit sweets, desserts, sugars, and sugary drinks.  · Choose heart-healthy fats.  · Limit cheese to 1 oz (28 g) per day.  · Eat more home-cooked food and less restaurant, buffet, and fast food.  · Limit fried foods.  · Cook foods using methods other than frying.  · Limit canned vegetables. If you do use them, rinse them well to decrease the sodium.  · When eating at a restaurant, ask that your food be prepared with less salt, or no salt if possible.  WHAT FOODS CAN I EAT?  Seek help from a dietitian for individual calorie needs.  Grains  Whole grain or whole wheat bread. Brown rice. Whole grain or whole wheat pasta. Quinoa, bulgur, and whole grain cereals. Low-sodium cereals. Corn or whole wheat flour tortillas. Whole grain cornbread. Whole grain crackers. Low-sodium crackers.  Vegetables  Fresh or frozen vegetables  (raw, steamed, roasted, or grilled). Low-sodium or reduced-sodium tomato and vegetable juices. Low-sodium or reduced-sodium tomato sauce and paste. Low-sodium or reduced-sodium canned vegetables.   Fruits  All fresh, canned (in natural juice), or frozen fruits.  Meat and Other Protein Products  Ground beef (85% or leaner), grass-fed beef, or beef trimmed of fat. Skinless chicken or turkey. Ground chicken or turkey. Pork trimmed of fat. All fish and seafood. Eggs. Dried beans, peas, or lentils. Unsalted nuts and seeds. Unsalted canned beans.  Dairy  Low-fat dairy products, such as skim or 1% milk, 2% or reduced-fat cheeses, low-fat ricotta or cottage cheese, or plain low-fat yogurt. Low-sodium or reduced-sodium cheeses.  Fats and Oils  Tub margarines without trans fats. Light or reduced-fat mayonnaise and salad dressings (reduced sodium). Avocado. Safflower, olive, or canola oils. Natural peanut or almond butter.  Other  Unsalted popcorn and pretzels.  The items listed above may not be a complete list of recommended foods or beverages. Contact your dietitian for more options.  WHAT FOODS ARE NOT RECOMMENDED?  Grains  White bread. White pasta. White rice. Refined cornbread. Bagels and croissants. Crackers that contain trans fat.  Vegetables  Creamed or fried vegetables. Vegetables in a cheese sauce. Regular canned vegetables. Regular canned tomato sauce and paste. Regular tomato and vegetable juices.  Fruits  Dried fruits. Canned fruit in light or heavy syrup. Fruit juice.  Meat and Other Protein   Products  Fatty cuts of meat. Ribs, chicken wings, bacon, sausage, bologna, salami, chitterlings, fatback, hot dogs, bratwurst, and packaged luncheon meats. Salted nuts and seeds. Canned beans with salt.  Dairy  Whole or 2% milk, cream, half-and-half, and cream cheese. Whole-fat or sweetened yogurt. Full-fat cheeses or blue cheese. Nondairy creamers and whipped toppings. Processed cheese, cheese spreads, or cheese  curds.  Condiments  Onion and garlic salt, seasoned salt, table salt, and sea salt. Canned and packaged gravies. Worcestershire sauce. Tartar sauce. Barbecue sauce. Teriyaki sauce. Soy sauce, including reduced sodium. Steak sauce. Fish sauce. Oyster sauce. Cocktail sauce. Horseradish. Ketchup and mustard. Meat flavorings and tenderizers. Bouillon cubes. Hot sauce. Tabasco sauce. Marinades. Taco seasonings. Relishes.  Fats and Oils  Butter, stick margarine, lard, shortening, ghee, and bacon fat. Coconut, palm kernel, or palm oils. Regular salad dressings.  Other  Pickles and olives. Salted popcorn and pretzels.  The items listed above may not be a complete list of foods and beverages to avoid. Contact your dietitian for more information.  WHERE CAN I FIND MORE INFORMATION?  National Heart, Lung, and Blood Institute: www.nhlbi.nih.gov/health/health-topics/topics/dash/     This information is not intended to replace advice given to you by your health care provider. Make sure you discuss any questions you have with your health care provider.     Document Released: 10/24/2011 Document Revised: 11/25/2014 Document Reviewed: 09/08/2013  Elsevier Interactive Patient Education ©2016 Elsevier Inc.

## 2016-08-28 NOTE — Progress Notes (Signed)
Pre visit review using our clinic review tool, if applicable. No additional management support is needed unless otherwise documented below in the visit note. 

## 2016-08-28 NOTE — Addendum Note (Signed)
Addended by: Pilar Grammes on: 08/28/2016 04:38 PM   Modules accepted: Orders

## 2016-08-28 NOTE — Assessment & Plan Note (Signed)
Will check B12 and sugar

## 2016-08-28 NOTE — Assessment & Plan Note (Signed)
Healthy but needs to work on fitness Really worrying about dementia--due to mom mostly. Counseled on this She prefers no mammograms Colon due 2020 Flu vaccine today

## 2016-09-13 ENCOUNTER — Encounter: Payer: Self-pay | Admitting: Internal Medicine

## 2016-10-01 ENCOUNTER — Other Ambulatory Visit: Payer: Self-pay | Admitting: Internal Medicine

## 2016-11-07 ENCOUNTER — Encounter: Payer: Self-pay | Admitting: Emergency Medicine

## 2016-11-07 ENCOUNTER — Telehealth: Payer: Self-pay | Admitting: Internal Medicine

## 2016-11-07 ENCOUNTER — Emergency Department: Payer: 59

## 2016-11-07 ENCOUNTER — Emergency Department
Admission: EM | Admit: 2016-11-07 | Discharge: 2016-11-07 | Disposition: A | Discharge status: Home / Self Care | Payer: 59 | Attending: Emergency Medicine | Admitting: Emergency Medicine

## 2016-11-07 DIAGNOSIS — Z7982 Long term (current) use of aspirin: Secondary | ICD-10-CM | POA: Insufficient documentation

## 2016-11-07 DIAGNOSIS — R079 Chest pain, unspecified: Secondary | ICD-10-CM | POA: Diagnosis not present

## 2016-11-07 DIAGNOSIS — Z79899 Other long term (current) drug therapy: Secondary | ICD-10-CM | POA: Insufficient documentation

## 2016-11-07 DIAGNOSIS — Z87891 Personal history of nicotine dependence: Secondary | ICD-10-CM | POA: Insufficient documentation

## 2016-11-07 LAB — CBC
HCT: 41 % (ref 35.0–47.0)
Hemoglobin: 13.9 g/dL (ref 12.0–16.0)
MCH: 28.2 pg (ref 26.0–34.0)
MCHC: 33.9 g/dL (ref 32.0–36.0)
MCV: 83.2 fL (ref 80.0–100.0)
PLATELETS: 242 10*3/uL (ref 150–440)
RBC: 4.92 MIL/uL (ref 3.80–5.20)
RDW: 15.1 % — AB (ref 11.5–14.5)
WBC: 9.5 10*3/uL (ref 3.6–11.0)

## 2016-11-07 LAB — BASIC METABOLIC PANEL
Anion gap: 7 (ref 5–15)
BUN: 18 mg/dL (ref 6–20)
CHLORIDE: 108 mmol/L (ref 101–111)
CO2: 25 mmol/L (ref 22–32)
CREATININE: 0.73 mg/dL (ref 0.44–1.00)
Calcium: 9.1 mg/dL (ref 8.9–10.3)
GFR calc Af Amer: >60 mL/min (ref >60)
GFR calc non Af Amer: >60 mL/min (ref >60)
GLUCOSE: 106 mg/dL — AB (ref 65–99)
Potassium: 4.1 mmol/L (ref 3.5–5.1)
SODIUM: 140 mmol/L (ref 135–145)

## 2016-11-07 LAB — TROPONIN I
Troponin I: <0.03 ng/mL (ref <0.03)
Troponin I: <0.03 ng/mL (ref <0.03)

## 2016-11-07 MED ORDER — ASPIRIN 81 MG PO CHEW
324.0000 mg | CHEWABLE_TABLET | Freq: Once | ORAL | Status: AC
  Administered 2016-11-07: 324 mg via ORAL
  Filled 2016-11-07: qty 4

## 2016-11-07 NOTE — Telephone Encounter (Signed)
If noone else can see her, you can add her on at 12:45 tomorrow morning---tell her I will be late by then

## 2016-11-07 NOTE — Telephone Encounter (Signed)
Patient walked in this morning.  Stating she left [facility] er with chest pains.  They wanted her to follow up with you within 2 days.  Can she be worked?   Pt stated she was still have chest pains.  Larene Beach looked at patients chart from er.  PT has not called dr Nehemiah Massed (cardiology) yet.  PT stated she will call cardiology office today.

## 2016-11-07 NOTE — ED Triage Notes (Signed)
Pt ambulatory to triage without difficulty or distress noted; Pt reports left sided CP, onset 69min PTA while at work; pt denies hx of same

## 2016-11-07 NOTE — Telephone Encounter (Signed)
I spoke to patient and she said she scheduled an appointment with Cardiology tomorrow at 11:30.  Dr.Letvak said she didn't need to come see him since she has the appointment with Cardiology.

## 2016-11-07 NOTE — ED Provider Notes (Signed)
[facility] Emergency Department Provider Note  ____________________________________________  Time seen: Approximately 7:21 AM  I have reviewed the triage vital signs and the nursing notes.   HISTORY  Chief Complaint Chest Pain   HPI Theresa Huber is a 65 y.o. female with h/o IBS, GERD, osteoporosis who presents for evaluation of chest pain. Pain started while she was at work this morning. She works Scientist, research (medical) at [employer]. She describes the pain as sharp, knife like located in the left chest that is intermittent and happened a few times this morning. The pain is brought on by brisk movements of her torso. The pain comes on mildly and then progresses to severe in a few seconds and resolves with no intervention in a few minutes. NO dizziness, shortness of breath, nausea, vomiting. No personal or family history of ischemic heart disease, patient is not a smoker, no personal or family history blood clots, no recent travel or immobilization, no leg pain or swelling, no hemoptysis, no exogenous hormones. No cough or congestion, the pain is not pleuritic in nature, does not radiate to the back, no paresthesias of her extremities.  Past Medical History:  Diagnosis Date  . GERD (gastroesophageal reflux disease)   . IBS (irritable bowel syndrome)   . Osteoporosis    T score -2.5 at hip in 2008    Patient Active Problem List   Diagnosis Date Noted  . Sensory loss 08/28/2016  . Palpitations 02/13/2016  . Routine general medical examination at a health care facility 03/17/2012  . ACTINIC KERATOSIS 08/14/2010  . OSTEOPOROSIS 04/14/2008  . Hyperlipemia 06/08/2007  . GERD 06/08/2007    Past Surgical History:  Procedure Laterality Date  . ABDOMINAL HYSTERECTOMY    . CESAREAN SECTION  1983  . KNEE ARTHROSCOPY  1990   LEFT  . LIPOMA EXCISION  1980   LEFT THIGH    Prior to Admission medications   Medication Sig Start Date End Date Taking?  Authorizing Provider  aspirin EC 81 MG tablet Take 81 mg by mouth daily.   Yes Historical Provider, MD  pantoprazole (PROTONIX) 40 MG tablet TAKE 1 TABLET BY MOUTH TWO TIMES DAILY Patient taking differently: TAKE 1 TABLET BY MOUTH ONCE DAILY 10/01/16  Yes Venia Carbon, MD  Probiotic Product (PROBIOTIC DAILY PO) Take 1 Dose by mouth daily.   Yes Historical Provider, MD    Allergies Shellfish allergy; Ibandronate sodium; and Sulfonamide derivatives  Family History  Problem Relation Age of Onset  . Diabetes Mother   . Heart disease Mother     ? A FIB  . Cancer Mother     ? sCHWANOMMA, SKIN CANCER  . Sleep apnea Mother   . Dementia Mother 50    Alzheimer's  . COPD Father   . Hyperlipidemia Father   . Sleep apnea Father   . Emphysema Father   . Mitral valve prolapse Sister   . Cancer Sister     MELANOMA    Social History Social History  Substance Use Topics  . Smoking status: Former Smoker    Packs/day: 2.00    Years: 8.00    Types: Cigarettes    Quit date: 11/18/1984  . Smokeless tobacco: Never Used  . Alcohol use No    Review of Systems  Constitutional: Negative for fever. Eyes: Negative for visual changes. ENT: Negative for sore throat. Neck: No neck pain  Cardiovascular: + chest pain. Respiratory: Negative for shortness of breath. Gastrointestinal: Negative for abdominal  pain, vomiting or diarrhea. Genitourinary: Negative for dysuria. Musculoskeletal: Negative for back pain. Skin: Negative for rash. Neurological: Negative for headaches, weakness or numbness. Psych: No SI or HI  ____________________________________________   PHYSICAL EXAM:  VITAL SIGNS: ED Triage Vitals  Enc Vitals Group     BP 11/07/16 0630 129/71     Pulse Rate 11/07/16 0630 81     Resp 11/07/16 0630 17     Temp --      Temp src --      SpO2 11/07/16 0630 96 %     Weight 11/07/16 0617 172 lb (78 kg)     Height 11/07/16 0617 5\' 1"  (1.549 m)     Head Circumference --      Peak  Flow --      Pain Score 11/07/16 0617 2     Pain Loc --      Pain Edu? --      Excl. in [place]? --     Constitutional: Alert and oriented. Well appearing and in no apparent distress. HEENT:      Head: Normocephalic and atraumatic.         Eyes: Conjunctivae are normal. Sclera is non-icteric. EOMI. PERRL      Mouth/Throat: Mucous membranes are moist.       Neck: Supple with no signs of meningismus. Cardiovascular: Regular rate and rhythm. No murmurs, gallops, or rubs. 2+ symmetrical distal pulses are present in all extremities. No JVD. Mild ttp over the area of the pain in the L chest wall area. Respiratory: Normal respiratory effort. Lungs are clear to auscultation bilaterally. No wheezes, crackles, or rhonchi.  Gastrointestinal: Soft, non tender, and non distended with positive bowel sounds. No rebound or guarding. Genitourinary: No CVA tenderness. Musculoskeletal: Nontender with normal range of motion in all extremities. No edema, cyanosis, or erythema of extremities. Neurologic: Normal speech and language. Face is symmetric. Moving all extremities. No gross focal neurologic deficits are appreciated. Skin: Skin is warm, dry and intact. No rash noted. Psychiatric: Mood and affect are normal. Speech and behavior are normal.  ____________________________________________   LABS (all labs ordered are listed, but only abnormal results are displayed)  Labs Reviewed  BASIC METABOLIC PANEL - Abnormal; Notable for the following:       Result Value   Glucose, Bld 106 (*)    All other components within normal limits  CBC - Abnormal; Notable for the following:    RDW 15.1 (*)    All other components within normal limits  TROPONIN I  TROPONIN I   ____________________________________________  EKG  ED ECG REPORT I, Rudene Re, the attending physician, personally viewed and interpreted this ECG.  Normal sinus rhythm, rate of 89, normal intervals, normal axis, no ST elevations or  depressions. ____________________________________________  RADIOLOGY  CXR:  Negative ____________________________________________   PROCEDURES  Procedure(s) performed: None Procedures Critical Care performed:  None ____________________________________________   INITIAL IMPRESSION / ASSESSMENT AND PLAN / ED COURSE  65 y.o. female with h/o IBS, GERD, osteoporosis who presents for evaluation of chest pain.   Chest pain in a 65 y.o. female with low suspicion for cardiac (HEART score 1) or other serious etiology (including aortic dissection, pneumonia, pneumothorax, or pulmonary embolism) based her history and physical exam in the ED today. EKG normal. Plan for labs including CBC, chemistries and troponin now and in 3 hours, CXR and re-evaluation for disposition. Will give full dose ASA . Will observe patient on cardiac monitor while in the ED  and pain control.     Clinical Course as of Nov 07 1030  Thu Nov 07, 2016  1024 Troponin x 2 negative. Labs and CXR with no acute findigns. Patient remains stable with no CP. No arrhythmias on telemetry. Will be discharged home with f/u with PCP for further evaluation.  [CV]    Clinical Course User Index [CV] Rudene Re, MD    Pertinent labs & imaging results that were available during my care of the patient were reviewed by me and considered in my medical decision making (see chart for details).    ____________________________________________   FINAL CLINICAL IMPRESSION(S) / ED DIAGNOSES  Final diagnoses:  Chest pain, unspecified type      NEW MEDICATIONS STARTED DURING THIS VISIT:  New Prescriptions   No medications on file     Note:  This document was prepared using Dragon voice recognition software and may include unintentional dictation errors.    Rudene Re, MD 11/07/16 1031

## 2016-11-07 NOTE — Discharge Instructions (Signed)

## 2016-11-08 DIAGNOSIS — E782 Mixed hyperlipidemia: Secondary | ICD-10-CM | POA: Diagnosis not present

## 2016-11-08 DIAGNOSIS — I208 Other forms of angina pectoris: Secondary | ICD-10-CM | POA: Diagnosis not present

## 2017-02-24 ENCOUNTER — Telehealth: Payer: Self-pay

## 2017-02-24 NOTE — Telephone Encounter (Signed)
Let her know that only thing she is probably due for is checking for hep C--this can wait till her next physical.  Make sure there is a deferral for her mammogram--he choice

## 2017-02-24 NOTE — Telephone Encounter (Signed)
Spoke to pt. It was the HIV screening and mammogram. I deferred her Mammogram for 5 years at her request. She used to be a blood donor so I completed her HIV Screening.

## 2017-02-24 NOTE — Telephone Encounter (Signed)
Pt received automated call to get updated on immunization and any testing needed. Pt states she does not know what she needs to have done if anything and pt request cb after Dr Silvio Pate reviews her chart. pt had annual 08/28/16 and at that visit pt did not want to have mammograms and colon due 2020.Theresa Huber

## 2017-03-24 ENCOUNTER — Encounter: Payer: Self-pay | Admitting: Internal Medicine

## 2017-03-24 ENCOUNTER — Encounter: Payer: Self-pay | Admitting: *Deleted

## 2017-03-24 ENCOUNTER — Telehealth: Payer: Self-pay | Admitting: *Deleted

## 2017-03-24 ENCOUNTER — Ambulatory Visit (INDEPENDENT_AMBULATORY_CARE_PROVIDER_SITE_OTHER): Payer: BLUE CROSS/BLUE SHIELD | Admitting: Internal Medicine

## 2017-03-24 VITALS — BP 118/72 | HR 104 | Temp 97.9°F | Wt 171.2 lb

## 2017-03-24 DIAGNOSIS — J0100 Acute maxillary sinusitis, unspecified: Secondary | ICD-10-CM

## 2017-03-24 NOTE — Progress Notes (Signed)
Pre visit review using our clinic review tool, if applicable. No additional management support is needed unless otherwise documented below in the visit note. 

## 2017-03-24 NOTE — Telephone Encounter (Signed)
Will evaluate at the OV 

## 2017-03-24 NOTE — Telephone Encounter (Signed)
Pt has 5/7 appt      [place] Patient Name: Theresa Huber Gender: Female DOB: 01/05/1951 Age: 66 Y 2 M 18 D Return Phone Number: [phone] (Primary) City/State/Zip:  Client [place] - Client Client Site [place] - Day Physician Viviana Simpler - MD Who Is Calling Patient / Member / Family / Caregiver Call Type Triage / Clinical Relationship To Patient Self Return Phone Number [phone] (Primary) Chief Complaint Cough Reason for Call Symptomatic / Request for Health Information Initial Comment Caller needs a prescription for a sinus infection. Caller states she has a sore throat, runny nose, and cough. Appointment Disposition EMR Appointment Not Necessary Info pasted into Epic Yes Nurse Assessment Nurse: Harlow Mares, RN, Suanne Marker Date/Time (Eastern Time): 03/22/2017 12:11:24 PM Confirm and document reason for call. If symptomatic, describe symptoms. ---Caller needs a prescription for a sinus infection. Caller states she has a sore throat, runny nose, and cough. Symptoms began earlier today. Denies fever. Does the PT have any chronic conditions? (i.e. diabetes, asthma, etc.) ---No Guidelines Guideline Title Affirmed Question Common Cold Cold with no complications Disp. Time Eilene Ghazi Time) Disposition Final User 03/22/2017 12:19:55 PM Home Care Yes Harlow Mares, RN, Suanne Marker Referrals GO TO FACILITY REFUSED Care Advice Given Per Guideline HOME CARE: You should be able to treat this at home. REASSURANCE: * It sounds like an uncomplicated cold that we can treat at home. * Colds are very common and may make you feel uncomfortable. * Colds are caused by viruses, and no medicine or 'shot' will cure an uncomplicated cold. * Colds are usually not serious. CALL BACK IF: * Fever lasts over 3 days * Runny nose lasts over 10 days * You  become short of breath * You become worse. CARE ADVICE given per Colds (Adult) guideline. FOR A STUFFY NOSE - USE NASAL WASHES: * Introduction: Saline (salt water) nasal irrigation (nasal wash) is an effective and simple home remedy for treating stuffy nose and sinus congestion. The nose can be irrigated by pouring, spraying, or squirting salt water into the nose and then letting it run back out. * How it Helps: The salt water rinses out excess mucus, washes out any irritants (dust, allergens) that might be present, and moistens the nasal cavity. * Methods: There are several ways to perform nasal irrigation. You can use a saline nasal spray bottle (available over-the-counter), a rubber ear syringe, a medical syringe without the needle, or a NETI POT. STEP-BY-STEP INSTRUCTIONS: FOR A RUNNY NOSE - BLOW YOUR NOSE: * Nasal mucus and discharge help wash viruses and bacteria out of the nose and sinuses. * Blowing your nose helps clean out your nose. Use a handkerchief or a paper tissue. * If the skin around your nostrils gets irritated, apply a tiny amount of petroleum ointment to the nasal openings once PLEASE NOTE: All timestamps contained within this report are represented as [place] Standard Time. CONFIDENTIALTY NOTICE: This fax transmission is intended only for the addressee. It contains information that is legally privileged, confidential or otherwise protected from use or disclosure. If you are not the intended recipient, you are strictly prohibited from reviewing, disclosing, copying using or disseminating any of this information or taking any action in reliance on or regarding this information. If you have received this fax in error, please notify us immediately by telephone so that we can arrange for its return to [place]. Phone:  [phone], Toll-Free: [phone], Fax: [phone] Page: 2 of 2 Call Id:

## 2017-03-24 NOTE — Progress Notes (Signed)
   Subjective:    Patient ID: Theresa Huber, female    DOB: 11-Nov-1951, 66 y.o.   MRN: [id number]  HPI Here with husband due to respiratory symptoms  This is as bad as her sinuses have ever been Fever, chills Lots drainage in throat with nausea and secondary cough Started 2 days ago  No SOB Some frontal headache Some ear pain yesterday Started with sore throat--- better with lozenges  Has tried mucinex and aspirin for fever  Current Outpatient Prescriptions on File Prior to Visit  Medication Sig Dispense Refill  . aspirin EC 81 MG tablet Take 81 mg by mouth daily.    . pantoprazole (PROTONIX) 40 MG tablet TAKE 1 TABLET BY MOUTH TWO TIMES DAILY (Patient taking differently: TAKE 1 TABLET BY MOUTH ONCE DAILY) 180 tablet 3   No current facility-administered medications on file prior to visit.     Allergies  Allergen Reactions  . Shellfish Allergy Hives and Itching  . Ibandronate Sodium     REACTION: bad chest pain  . Sulfonamide Derivatives     REACTION: hives    Past Medical History:  Diagnosis Date  . GERD (gastroesophageal reflux disease)   . IBS (irritable bowel syndrome)   . Osteoporosis    T score -2.5 at hip in 2008    Past Surgical History:  Procedure Laterality Date  . ABDOMINAL HYSTERECTOMY    . CESAREAN SECTION  1983  . KNEE ARTHROSCOPY  1990   LEFT  . LIPOMA EXCISION  1980   LEFT THIGH    Family History  Problem Relation Age of Onset  . Diabetes Mother   . Heart disease Mother     ? A FIB  . Cancer Mother     ? sCHWANOMMA, SKIN CANCER  . Sleep apnea Mother   . Dementia Mother 49    Alzheimer's  . COPD Father   . Hyperlipidemia Father   . Sleep apnea Father   . Emphysema Father   . Mitral valve prolapse Sister   . Cancer Sister     MELANOMA    Social History   Social History  . Marital status: Married    Spouse name: N/A  . Number of children: 1  . Years of education: N/A   Occupational History  . Receiving [employer]    Social History Main Topics  . Smoking status: Former Smoker    Packs/day: 2.00    Years: 8.00    Types: Cigarettes    Quit date: 11/18/1984  . Smokeless tobacco: Never Used  . Alcohol use Yes     Comment: occassionally  . Drug use: No  . Sexual activity: Not on file   Other Topics Concern  . Not on file   Social History Narrative  . No narrative on file   Review of Systems No pollen allergies Appetite is okay No diarrhea No rash    Objective:   Physical Exam  Constitutional: She appears well-nourished. No distress.  HENT:  Mild maxillary tenderness TMs normal Moderate nasal inflammation Slight pharyngeal erythema but no exudates  Neck: No thyromegaly present.  Pulmonary/Chest: Breath sounds normal. No respiratory distress. She has no wheezes. She has no rales.  Lymphadenopathy:    She has no cervical adenopathy.          Assessment & Plan:

## 2017-03-24 NOTE — Assessment & Plan Note (Signed)
Discussed that presentation is that of acute viral infection --in sinuses as well as systemic features Discussed symptom relief If worsening later in the week, would try empiric antibiotic

## 2017-03-26 ENCOUNTER — Encounter: Payer: Self-pay | Admitting: Internal Medicine

## 2017-03-26 MED ORDER — AMOXICILLIN 500 MG PO TABS: 1000.0000 mg | ORAL_TABLET | Freq: Two times a day (BID) | ORAL | 0 refills | Status: AC

## 2017-05-06 ENCOUNTER — Telehealth: Payer: Self-pay | Admitting: Internal Medicine

## 2017-05-06 NOTE — Telephone Encounter (Signed)
Pt called to request a refill of her medication.  She needs the pantoprazole called in.

## 2017-05-07 MED ORDER — PANTOPRAZOLE SODIUM 40 MG PO TBEC: 40.0000 mg | DELAYED_RELEASE_TABLET | Freq: Every day | ORAL | 3 refills | Status: DC

## 2017-05-07 NOTE — Telephone Encounter (Signed)
Rx sent electronically.  

## 2017-05-07 NOTE — Telephone Encounter (Signed)
Okay to refill for a year 

## 2017-05-08 ENCOUNTER — Other Ambulatory Visit: Payer: Self-pay | Admitting: Internal Medicine

## 2017-05-08 ENCOUNTER — Other Ambulatory Visit: Payer: Self-pay | Admitting: *Deleted

## 2017-05-08 MED ORDER — PANTOPRAZOLE SODIUM 40 MG PO TBEC: 40.0000 mg | DELAYED_RELEASE_TABLET | Freq: Every day | ORAL | 3 refills | Status: DC

## 2017-05-08 NOTE — Telephone Encounter (Signed)
Pt states she recently switched pharmacies. Rx sent to new location

## 2017-06-05 ENCOUNTER — Encounter: Payer: Self-pay | Admitting: Internal Medicine

## 2017-06-05 ENCOUNTER — Ambulatory Visit (INDEPENDENT_AMBULATORY_CARE_PROVIDER_SITE_OTHER): Payer: BLUE CROSS/BLUE SHIELD | Admitting: Internal Medicine

## 2017-06-05 VITALS — BP 118/82 | HR 82 | Temp 98.2°F | Wt 172.0 lb

## 2017-06-05 DIAGNOSIS — M542 Cervicalgia: Secondary | ICD-10-CM | POA: Diagnosis not present

## 2017-06-05 NOTE — Progress Notes (Signed)
   Subjective:    Patient ID: Theresa Huber, female    DOB: Jul 06, 1951, 66 y.o.   MRN: [id number]  HPI Here due to concerns about swollen glands  Was feeling clogged in right ear 2 days ago Tried drops for ear wax Then started getting bad pains along both sides of her neck (and swelling) Did just return from [place]--- had mosquito bites (also was sleeping on a couch)  No fever No sore throat No nasal congestion  Aleve did help the pain some  Current Outpatient Prescriptions on File Prior to Visit  Medication Sig Dispense Refill  . aspirin EC 81 MG tablet Take 81 mg by mouth daily.    . pantoprazole (PROTONIX) 40 MG tablet Take 1 tablet (40 mg total) by mouth daily. 90 tablet 3   No current facility-administered medications on file prior to visit.     Allergies  Allergen Reactions  . Shellfish Allergy Hives and Itching  . Ibandronate Sodium     REACTION: bad chest pain  . Sulfonamide Derivatives     REACTION: hives    Past Medical History:  Diagnosis Date  . GERD (gastroesophageal reflux disease)   . IBS (irritable bowel syndrome)   . Osteoporosis    T score -2.5 at hip in 2008    Past Surgical History:  Procedure Laterality Date  . ABDOMINAL HYSTERECTOMY    . CESAREAN SECTION  1983  . KNEE ARTHROSCOPY  1990   LEFT  . LIPOMA EXCISION  1980   LEFT THIGH    Family History  Problem Relation Age of Onset  . Diabetes Mother   . Heart disease Mother        ? A FIB  . Cancer Mother        ? sCHWANOMMA, SKIN CANCER  . Sleep apnea Mother   . Dementia Mother 43       Alzheimer's  . COPD Father   . Hyperlipidemia Father   . Sleep apnea Father   . Emphysema Father   . Mitral valve prolapse Sister   . Cancer Sister        MELANOMA    Social History   Social History  . Marital status: Married    Spouse name: N/A  . Number of children: 1  . Years of education: N/A   Occupational History  . Receiving [employer]   Social History Main Topics  .  Smoking status: Former Smoker    Packs/day: 2.00    Years: 8.00    Types: Cigarettes    Quit date: 11/18/1984  . Smokeless tobacco: Never Used  . Alcohol use Yes     Comment: occassionally  . Drug use: No  . Sexual activity: Not on file   Other Topics Concern  . Not on file   Social History Narrative  . No narrative on file   Review of Systems  No rash No N/V Appetite is fine     Objective:   Physical Exam  HENT:  Nose: Nose normal.  Mouth/Throat: No oropharyngeal exudate.  TMs and canals are normal   Neck: No thyromegaly present.  Pain along sternocleido portion of SCM--and tenderness No nodes enlarged  Lymphadenopathy:    She has no cervical adenopathy.          Assessment & Plan:

## 2017-06-05 NOTE — Assessment & Plan Note (Signed)
Reassured she doesn't seem to have any infection or nodes Seems to be strain of SCM Discussed a better sleeping situation with her next visit Heat/naproxen prn

## 2017-07-01 ENCOUNTER — Telehealth: Payer: BLUE CROSS/BLUE SHIELD | Admitting: Family

## 2017-07-01 DIAGNOSIS — J019 Acute sinusitis, unspecified: Secondary | ICD-10-CM

## 2017-07-01 DIAGNOSIS — B9689 Other specified bacterial agents as the cause of diseases classified elsewhere: Secondary | ICD-10-CM

## 2017-07-01 MED ORDER — AMOXICILLIN-POT CLAVULANATE 875-125 MG PO TABS: 1.0000 | ORAL_TABLET | Freq: Two times a day (BID) | ORAL | 0 refills | Status: DC

## 2017-07-01 NOTE — Progress Notes (Signed)

## 2017-09-03 ENCOUNTER — Encounter: Payer: Medicare HMO | Admitting: Internal Medicine

## 2017-09-11 ENCOUNTER — Ambulatory Visit (INDEPENDENT_AMBULATORY_CARE_PROVIDER_SITE_OTHER): Payer: Medicare HMO | Admitting: Internal Medicine

## 2017-09-11 ENCOUNTER — Encounter: Payer: Self-pay | Admitting: Internal Medicine

## 2017-09-11 VITALS — BP 122/70 | HR 81 | Temp 98.1°F | Ht 61.0 in | Wt 169.0 lb

## 2017-09-11 DIAGNOSIS — E785 Hyperlipidemia, unspecified: Secondary | ICD-10-CM | POA: Diagnosis not present

## 2017-09-11 DIAGNOSIS — K219 Gastro-esophageal reflux disease without esophagitis: Secondary | ICD-10-CM

## 2017-09-11 DIAGNOSIS — M810 Age-related osteoporosis without current pathological fracture: Secondary | ICD-10-CM

## 2017-09-11 DIAGNOSIS — Z0000 Encounter for general adult medical examination without abnormal findings: Secondary | ICD-10-CM

## 2017-09-11 DIAGNOSIS — Z7189 Other specified counseling: Secondary | ICD-10-CM | POA: Insufficient documentation

## 2017-09-11 DIAGNOSIS — Z23 Encounter for immunization: Secondary | ICD-10-CM

## 2017-09-11 NOTE — Assessment & Plan Note (Signed)
Discussed calcium/vitamin D Regular weight bearing exercise Will recheck DEXA---if sig decline, would consider bisphosphonate

## 2017-09-11 NOTE — Assessment & Plan Note (Signed)
Mild  Discussed statin as dementia preventative--still not excited Will recheck

## 2017-09-11 NOTE — Assessment & Plan Note (Signed)
I have personally reviewed the Medicare Annual Wellness questionnaire and have noted 1. The patient's medical and social history 2. Their use of alcohol, tobacco or illicit drugs 3. Their current medications and supplements 4. The patient's functional ability including ADL's, fall risks, home safety risks and hearing or visual             impairment. 5. Diet and physical activities 6. Evidence for depression or mood disorders  The patients weight, height, BMI and visual acuity have been recorded in the chart I have made referrals, counseling and provided education to the patient based review of the above and I have provided the pt with a written personalized care plan for preventive services.  I have provided you with a copy of your personalized plan for preventive services. Please take the time to review along with your updated medication list.  Will give prevnar and flu vaccines today Colon due 2020 Not excited about mammogram--discussed (she will consider) Recommended derm evaluation and eye appt Increase exercise

## 2017-09-11 NOTE — Progress Notes (Signed)
Subjective:    Patient ID: Theresa Huber, female    DOB: Jul 18, 1951, 66 y.o.   MRN: [id number]  HPI Here for Initial Medicare wellness visit and follow up of chronic health conditions Reviewed advanced directives and form Reviewed other doctors No tobacco Only alcohol occasionally--- twice a month (2 drinks) Exercising more Vision fine---needs to go back to [employer] is fine No falls No depression or anhedonia Independent with instrumental ADLs No Theresa problems of concern  Has had some left hand "weird sensation like bugs crawling on me" after working in the yard After raking Discussed nerve compression, etc  Takes the PPI daily Controls heartburn and no dysphagia  Discussed borderline cholesterol levels Not excited about Rx Still worries about dementia due to her mom's Alzheimer's  Discussed bone density Had borderline osteoporosis Discussed vitamin D/calcium/exercise  IBS--- alternating constipation and then explosions Some pain Doesn't treat  Current Outpatient Prescriptions on File Prior to Visit  Medication Sig Dispense Refill  . aspirin EC 81 MG tablet Take 81 mg by mouth daily.    . pantoprazole (PROTONIX) 40 MG tablet Take 1 tablet (40 mg total) by mouth daily. 90 tablet 3   No current facility-administered medications on file prior to visit.     Allergies  Allergen Reactions  . Shellfish Allergy Hives and Itching  . Ibandronate Sodium     REACTION: bad chest pain  . Sulfonamide Derivatives     REACTION: hives    Past Medical History:  Diagnosis Date  . GERD (gastroesophageal reflux disease)   . IBS (irritable bowel syndrome)   . Osteoporosis    T score -2.5 at hip in 2008    Past Surgical History:  Procedure Laterality Date  . ABDOMINAL HYSTERECTOMY    . CESAREAN SECTION  1983  . KNEE ARTHROSCOPY  1990   LEFT  . LIPOMA EXCISION  1980   LEFT THIGH    Family History  Problem Relation Age of Onset  . Diabetes Mother     . Heart disease Mother        ? A FIB  . Cancer Mother        ? sCHWANOMMA, SKIN CANCER  . Sleep apnea Mother   . Dementia Mother 32       Alzheimer's  . COPD Father   . Hyperlipidemia Father   . Sleep apnea Father   . Emphysema Father   . Mitral valve prolapse Sister   . Cancer Sister        MELANOMA    Social History   Social History  . Marital status: Married    Spouse name: N/A  . Number of children: 1  . Years of education: N/A   Occupational History  . 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 Smoking status: Former Smoker    Packs/day: 2.00    Years: 8.00    Types: Cigarettes    Quit date: 11/18/1984  . Smokeless tobacco: Never Used  . Alcohol use Yes     Comment: occassionally  . Drug use: No  . Sexual activity: Not on file   Other Topics Concern  . Not on file   Social History Narrative   Has living will   Husband is health care POA--considering changing to son   Would accept resuscitation   No tube feeds if cognitively unaware   Review of Systems  Weight down a little Appetite is good Sleeping later  now---stays up later since retiring Occasional tinnitus--hearing okay Wears seat belt No trouble with teeth---keeps up with dentist No chest pain or SOB. Never had stress test in December since she felt better. No dizziness or syncope Feet swell slightly if on them all day No sig back or joint pain Occasional acne. No ulcers     Objective:   Physical Exam  Constitutional: She is oriented to person, place, and time. She appears well-nourished. No distress.  HENT:  Mouth/Throat: Oropharynx is clear and moist.  Neck: No thyromegaly present.  Cardiovascular: Normal rate, regular rhythm, normal heart sounds and intact distal pulses.  Exam reveals no gallop.   No murmur heard. Pulmonary/Chest: Effort normal and breath sounds normal. No respiratory distress. She has no wheezes. She has no rales.  Abdominal: Soft. She  exhibits no distension. There is no tenderness. There is no guarding.  Musculoskeletal: She exhibits no edema or tenderness.  Lymphadenopathy:    She has no cervical adenopathy.  Neurological: She is alert and oriented to person, place, and time.  President--- "Milinda Pointer,  [phone]  "not much for math" D-l-r-[id number]-w Recall 3/3  Skin: No rash noted. No erythema.  Psychiatric: She has a normal mood and affect.          Assessment & Plan:

## 2017-09-11 NOTE — Patient Instructions (Addendum)
Please start a multivitamin. If that bothers your stomach, just take vitamin D [id number] units daily.   DASH Eating Plan DASH stands for "Dietary Approaches to Stop Hypertension." The DASH eating plan is a healthy eating plan that has been shown to reduce high blood pressure (hypertension). It may also reduce your risk for type 2 diabetes, heart disease, and stroke. The DASH eating plan may also help with weight loss. What are tips for following this plan? General guidelines  Avoid eating more than 2,300 mg (milligrams) of salt (sodium) a day. If you have hypertension, you may need to reduce your sodium intake to 1,500 mg a day.  Limit alcohol intake to no more than 1 drink a day for nonpregnant women and 2 drinks a day for men. One drink equals 12 oz of beer, 5 oz of wine, or 1 oz of hard liquor.  Work with your health care provider to maintain a healthy body weight or to lose weight. Ask what an ideal weight is for you.  Get at least 30 minutes of exercise that causes your heart to beat faster (aerobic exercise) most days of the week. Activities may include walking, swimming, or biking.  Work with your health care provider or diet and nutrition specialist (dietitian) to adjust your eating plan to your individual calorie needs. Reading food labels  Check food labels for the amount of sodium per serving. Choose foods with less than 5 percent of the Daily Value of sodium. Generally, foods with less than 300 mg of sodium per serving fit into this eating plan.  To find whole grains, look for the word "whole" as the first word in the ingredient list. Shopping  Buy products labeled as "low-sodium" or "no salt added."  Buy fresh foods. Avoid canned foods and premade or frozen meals. Cooking  Avoid adding salt when cooking. Use salt-free seasonings or herbs instead of table salt or sea salt. Check with your health care provider or pharmacist before using salt substitutes.  Do not fry foods.  Cook foods using healthy methods such as baking, boiling, grilling, and broiling instead.  Cook with heart-healthy oils, such as olive, canola, soybean, or sunflower oil. Meal planning   Eat a balanced diet that includes: ? 5 or more servings of fruits and vegetables each day. At each meal, try to fill half of your plate with fruits and vegetables. ? Up to 6-8 servings of whole grains each day. ? Less than 6 oz of lean meat, poultry, or fish each day. A 3-oz serving of meat is about the same size as a deck of cards. One egg equals 1 oz. ? 2 servings of low-fat dairy each day. ? A serving of nuts, seeds, or beans 5 times each week. ? Heart-healthy fats. Healthy fats called Omega-3 fatty acids are found in foods such as flaxseeds and coldwater fish, like sardines, salmon, and mackerel.  Limit how much you eat of the following: ? Canned or prepackaged foods. ? Food that is high in trans fat, such as fried foods. ? Food that is high in saturated fat, such as fatty meat. ? Sweets, desserts, sugary drinks, and other foods with added sugar. ? Full-fat dairy products.  Do not salt foods before eating.  Try to eat at least 2 vegetarian meals each week.  Eat more home-cooked food and less restaurant, buffet, and fast food.  When eating at a restaurant, ask that your food be prepared with less salt or no salt, if possible. What  foods are recommended? The items listed may not be a complete list. Talk with your dietitian about what dietary choices are best for you. Grains Whole-grain or whole-wheat bread. Whole-grain or whole-wheat pasta. Brown rice. [place]row. Bulgur. Whole-grain and low-sodium cereals. Pita bread. Low-fat, low-sodium crackers. Whole-wheat flour tortillas. Vegetables Fresh or frozen vegetables (raw, steamed, roasted, or grilled). Low-sodium or reduced-sodium tomato and vegetable juice. Low-sodium or reduced-sodium tomato sauce and tomato paste. Low-sodium or reduced-sodium  canned vegetables. Fruits All fresh, dried, or frozen fruit. Canned fruit in natural juice (without added sugar). Meat and other protein foods Skinless chicken or [place]. Ground chicken or [place]. Pork with fat trimmed off. Fish and seafood. Egg whites. Dried beans, peas, or lentils. Unsalted nuts, nut butters, and seeds. Unsalted canned beans. Lean cuts of beef with fat trimmed off. Low-sodium, lean deli meat. Dairy Low-fat (1%) or fat-free (skim) milk. Fat-free, low-fat, or reduced-fat cheeses. Nonfat, low-sodium ricotta or cottage cheese. Low-fat or nonfat yogurt. Low-fat, low-sodium cheese. Fats and oils Soft margarine without trans fats. Vegetable oil. Low-fat, reduced-fat, or light mayonnaise and salad dressings (reduced-sodium). Canola, safflower, olive, soybean, and sunflower oils. Avocado. Seasoning and other foods Herbs. Spices. Seasoning mixes without salt. Unsalted popcorn and pretzels. Fat-free sweets. What foods are not recommended? The items listed may not be a complete list. Talk with your dietitian about what dietary choices are best for you. Grains Baked goods made with fat, such as croissants, muffins, or some breads. Dry pasta or rice meal packs. Vegetables Creamed or fried vegetables. Vegetables in a cheese sauce. Regular canned vegetables (not low-sodium or reduced-sodium). Regular canned tomato sauce and paste (not low-sodium or reduced-sodium). Regular tomato and vegetable juice (not low-sodium or reduced-sodium). Angie Fava. Olives. Fruits Canned fruit in a light or heavy syrup. Fried fruit. Fruit in cream or butter sauce. Meat and other protein foods Fatty cuts of meat. Ribs. Fried meat. Berniece Salines. Sausage. Bologna and other processed lunch meats. Salami. Fatback. Hotdogs. Bratwurst. Salted nuts and seeds. Canned beans with added salt. Canned or smoked fish. Whole eggs or egg yolks. Chicken or [place] with skin. Dairy Whole or 2% milk, cream, and half-and-half. Whole or  full-fat cream cheese. Whole-fat or sweetened yogurt. Full-fat cheese. Nondairy creamers. Whipped toppings. Processed cheese and cheese spreads. Fats and oils Butter. Stick margarine. Lard. Shortening. Ghee. Bacon fat. Tropical oils, such as coconut, palm kernel, or palm oil. Seasoning and other foods Salted popcorn and pretzels. Onion salt, garlic salt, seasoned salt, table salt, and sea salt. Worcestershire sauce. Tartar sauce. Barbecue sauce. Teriyaki sauce. Soy sauce, including reduced-sodium. Steak sauce. Canned and packaged gravies. Fish sauce. Oyster sauce. Cocktail sauce. Horseradish that you find on the shelf. Ketchup. Mustard. Meat flavorings and tenderizers. Bouillon cubes. Hot sauce and Tabasco sauce. Premade or packaged marinades. Premade or packaged taco seasonings. Relishes. Regular salad dressings. Where to find more information:  National Heart, Lung, and [place]: https://wilson-eaton.com/  American Heart Association: www.heart.org Summary  The DASH eating plan is a healthy eating plan that has been shown to reduce high blood pressure (hypertension). It may also reduce your risk for type 2 diabetes, heart disease, and stroke.  With the DASH eating plan, you should limit salt (sodium) intake to 2,300 mg a day. If you have hypertension, you may need to reduce your sodium intake to 1,500 mg a day.  When on the DASH eating plan, aim to eat more fresh fruits and vegetables, whole grains, lean proteins, low-fat dairy, and heart-healthy fats.  Work with  your health care provider or diet and nutrition specialist (dietitian) to adjust your eating plan to your individual calorie needs. This information is not intended to replace advice given to you by your health care provider. Make sure you discuss any questions you have with your health care provider. Document Released: 10/24/2011 Document Revised: 10/28/2016 Document Reviewed: 10/28/2016 Elsevier Interactive Patient Education  2017  [employer].

## 2017-09-11 NOTE — Assessment & Plan Note (Signed)
See social history 

## 2017-09-11 NOTE — Progress Notes (Signed)
Hearing Screening   Method: Audiometry   125Hz  250Hz  500Hz  1000Hz  2000Hz  3000Hz  4000Hz  6000Hz  8000Hz   Right ear:   40 40 20  40    Left ear:   40 40 40  0      Visual Acuity Screening   Right eye Left eye Both eyes  Without correction: 20/30 20/20 20/20   With correction:

## 2017-09-11 NOTE — Addendum Note (Signed)
Addended by: Pilar Grammes on: 09/11/2017 05:34 PM   Modules accepted: Orders

## 2017-09-11 NOTE — Assessment & Plan Note (Signed)
Quiet on Rx

## 2017-09-12 LAB — CBC
HCT: 44 % (ref 36.0–46.0)
HEMOGLOBIN: 14.1 g/dL (ref 12.0–15.0)
MCHC: 32.1 g/dL (ref 30.0–36.0)
MCV: 87 fl (ref 78.0–100.0)
PLATELETS: 253 10*3/uL (ref 150.0–400.0)
RBC: 5.05 Mil/uL (ref 3.87–5.11)
RDW: 15.2 % (ref 11.5–15.5)
WBC: 12.6 10*3/uL — AB (ref 4.0–10.5)

## 2017-09-12 LAB — LIPID PANEL
CHOL/HDL RATIO: 5
Cholesterol: 231 mg/dL — ABNORMAL HIGH (ref 0–200)
HDL: 46 mg/dL (ref >39.00)
NONHDL: 185.37
Triglycerides: 281 mg/dL — ABNORMAL HIGH (ref 0.0–149.0)
VLDL: 56.2 mg/dL — ABNORMAL HIGH (ref 0.0–40.0)

## 2017-09-12 LAB — COMPREHENSIVE METABOLIC PANEL
ALT: 16 U/L (ref 0–35)
AST: 18 U/L (ref 0–37)
Albumin: 4 g/dL (ref 3.5–5.2)
Alkaline Phosphatase: 126 U/L — ABNORMAL HIGH (ref 39–117)
BILIRUBIN TOTAL: 0.5 mg/dL (ref 0.2–1.2)
BUN: 17 mg/dL (ref 6–23)
CALCIUM: 9.8 mg/dL (ref 8.4–10.5)
CHLORIDE: 104 meq/L (ref 96–112)
CO2: 31 meq/L (ref 19–32)
CREATININE: 0.86 mg/dL (ref 0.40–1.20)
GFR: 70.38 mL/min (ref >60.00)
GLUCOSE: 84 mg/dL (ref 70–99)
Potassium: 5 mEq/L (ref 3.5–5.1)
SODIUM: 142 meq/L (ref 135–145)
Total Protein: 7.4 g/dL (ref 6.0–8.3)

## 2017-09-12 LAB — LDL CHOLESTEROL, DIRECT: Direct LDL: 143 mg/dL

## 2017-10-16 DIAGNOSIS — R69 Illness, unspecified: Secondary | ICD-10-CM | POA: Diagnosis not present

## 2017-12-18 ENCOUNTER — Other Ambulatory Visit: Payer: Self-pay

## 2017-12-18 ENCOUNTER — Telehealth: Payer: Self-pay | Admitting: Internal Medicine

## 2017-12-18 MED ORDER — PANTOPRAZOLE SODIUM 40 MG PO TBEC: 40.0000 mg | DELAYED_RELEASE_TABLET | Freq: Every day | ORAL | 3 refills | Status: DC

## 2017-12-18 NOTE — Telephone Encounter (Signed)
Copied from [place] [phone]. Topic: Quick Communication - Rx Refill/Question >> Dec 18, 2017 11:03 AM Antonieta Iba C wrote: Medication:  pantoprazole (PROTONIX) 40 MG tablet    Has the patient contacted their pharmacy? No    (Agent: If no, request that the patient contact the pharmacy for the refill.)   Preferred Pharmacy (with phone number or street name): [id number] [place], [place]: Please be advised that RX refills may take up to 3 business days. We ask that you follow-up with your pharmacy.

## 2018-02-06 ENCOUNTER — Ambulatory Visit (INDEPENDENT_AMBULATORY_CARE_PROVIDER_SITE_OTHER): Payer: Medicare HMO | Admitting: Internal Medicine

## 2018-02-06 ENCOUNTER — Encounter: Payer: Self-pay | Admitting: Internal Medicine

## 2018-02-06 VITALS — BP 116/72 | HR 89 | Temp 98.1°F | Ht 61.0 in | Wt 174.0 lb

## 2018-02-06 DIAGNOSIS — K2100 Gastro-esophageal reflux disease with esophagitis, without bleeding: Secondary | ICD-10-CM

## 2018-02-06 NOTE — Patient Instructions (Signed)
Please try the pantoprazole twice a day on an empty stomach. If you are still having trouble next week, I will change your prescription.

## 2018-02-06 NOTE — Progress Notes (Signed)
Subjective:    Patient ID: Theresa Huber, female    DOB: [birth year], 67 y.o.   MRN: [id number]  HPI Here with husband due to reflux problems With husband  Was up all night a few days ago---took extra prilosec OTC Esophageal and throat burning protonix helped in the past--but the pill has changed (manufacturer changed on her Rx) Cough due to this  No change in diet Only 1 cup of coffee in AM--no change Uses 2 pillows at night--no change Does take the medicine on an empty stomach  Current Outpatient Medications on File Prior to Visit  Medication Sig Dispense Refill  . aspirin EC 81 MG tablet Take 81 mg by mouth daily.    . pantoprazole (PROTONIX) 40 MG tablet Take 1 tablet (40 mg total) by mouth daily. 90 tablet 3   No current facility-administered medications on file prior to visit.     Allergies  Allergen Reactions  . Shellfish Allergy Hives and Itching  . Ibandronate Sodium     REACTION: bad chest pain  . Sulfonamide Derivatives     REACTION: hives    Past Medical History:  Diagnosis Date  . GERD (gastroesophageal reflux disease)   . IBS (irritable bowel syndrome)   . Osteoporosis    T score -2.5 at hip in 2008    Past Surgical History:  Procedure Laterality Date  . ABDOMINAL HYSTERECTOMY    . CESAREAN SECTION  1983  . KNEE ARTHROSCOPY  1990   LEFT  . LIPOMA EXCISION  1980   LEFT THIGH    Family History  Problem Relation Age of Onset  . Diabetes Mother   . Heart disease Mother        ? A FIB  . Cancer Mother        ? sCHWANOMMA, SKIN CANCER  . Sleep apnea Mother   . Dementia Mother 66       Alzheimer's  . COPD Father   . Hyperlipidemia Father   . Sleep apnea Father   . Emphysema Father   . Mitral valve prolapse Sister   . Cancer Sister        MELANOMA    Social History   Socioeconomic History  . Marital status: Married    Spouse name: Not on file  . Number of children: 1  . Years of education: Not on file  . Highest education  level: Not on file  Occupational History  . Occupation: Receiving    Employer: [employer]: Retired now  Scientific laboratory technician  . Financial resource strain: Not on file  . Food insecurity:    Worry: Not on file    Inability: Not on file  . Transportation needs:    Medical: Not on file    Non-medical: Not on file  Tobacco Use  . Smoking status: Former Smoker    Packs/day: 2.00    Years: 8.00    Pack years: 16.00    Types: Cigarettes    Last attempt to quit: 11/18/1984    Years since quitting: 33.2  . Smokeless tobacco: Never Used  Substance and Sexual Activity  . Alcohol use: Yes    Comment: occassionally  . Drug use: No  . Sexual activity: Not on file  Lifestyle  . Physical activity:    Days per week: Not on file    Minutes per session: Not on file  . Stress: Not on file  Relationships  . Social connections:    Talks on  phone: Not on file    Gets together: Not on file    Attends religious service: Not on file    Active member of club or organization: Not on file    Attends meetings of clubs or organizations: Not on file    Relationship status: Not on file  . Intimate partner violence:    Fear of current or ex partner: Not on file    Emotionally abused: Not on file    Physically abused: Not on file    Forced sexual activity: Not on file  Other Topics Concern  . Not on file  Social History Narrative   Has living will   Husband is health care POA--considering changing to son   Would accept resuscitation   No tube feeds if cognitively unaware   Review of Systems  Only smoked long ago No dysphagia No change in voice    Objective:   Physical Exam  Pulmonary/Chest: Effort normal and breath sounds normal. No respiratory distress. She has no wheezes. She has no rales.  Abdominal: Soft. She exhibits no distension. There is no tenderness. There is no rebound and no guarding.          Assessment & Plan:

## 2018-02-06 NOTE — Assessment & Plan Note (Signed)
Has been exacerbated since change in manufacturer [facility]--but they weren't sure which manufacturer she did well with For now, will try this med bid Otherwise, would change to nexium or prilosec (for now, would try omeprazole 20 bid if not better next week)

## 2018-02-13 DIAGNOSIS — R69 Illness, unspecified: Secondary | ICD-10-CM | POA: Diagnosis not present

## 2018-02-17 DIAGNOSIS — H2513 Age-related nuclear cataract, bilateral: Secondary | ICD-10-CM | POA: Diagnosis not present

## 2018-03-13 ENCOUNTER — Ambulatory Visit: Payer: Self-pay | Admitting: *Deleted

## 2018-03-13 NOTE — Telephone Encounter (Signed)
Pt has swelling in her lower extremities, from her mid leg down including her feet. Mostly in her feet, the left one greater than the right one. She stated she had ridden in a car for 6 hours out of town but had stopped for breaks. The longest time sitting was about 3 hours. She denies shortness of breath, fever, chest pain or and swelling in her upper extremities.  Even when she has her feet elevated, they do not go down.  She states it is a little uncomfortable but not painful. Home care advice given to avoid salty foods and drink plenty of water to flush her system. Pt voiced understanding. Appointment made for Monday. Advised that if she started having worsening symptoms to give [place] a call back, and go to Urgent Care or ED. Pt voiced understanding.  .Reason for Disposition . [1] MILD swelling of both ankles (i.e., pedal edema) AND [2] new onset or worsening  Answer Assessment - Initial Assessment Questions 1. ONSET: "When did the swelling start?" (e.g., minutes, hours, days)     Evening of the first night 2. LOCATION: "What part of the leg is swollen?"  "Are both legs swollen or just one leg?"     It is midway down her leg including the feet 3. SEVERITY: "How bad is the swelling?" (e.g., localized; mild, moderate, severe)  - Localized - small area of swelling localized to one leg  - MILD pedal edema - swelling limited to foot and ankle, pitting edema < 1/4 inch (6 mm) deep, rest and elevation eliminate most or all swelling  - MODERATE edema - swelling of lower leg to knee, pitting edema > 1/4 inch (6 mm) deep, rest and elevation only partially reduce swelling  - SEVERE edema - swelling extends above knee, facial or hand swelling present      moderate 4. REDNESS: "Does the swelling look red or infected?"     no 5. PAIN: "Is the swelling painful to touch?" If so, ask: "How painful is it?"   (Scale 1-10; mild, moderate or severe)     More uncomfortable than pain 6. FEVER: "Do you have a  fever?" If so, ask: "What is it, how was it measured, and when did it start?"      no 7. CAUSE: "What do you think is causing the leg swelling?"     Probably circulation problems 8. MEDICAL HISTORY: "Do you have a history of heart failure, kidney disease, liver failure, or cancer?"     no 9. 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 OTHER SYMPTOMS: "Do you have any other symptoms?" (e.g., chest pain, difficulty breathing)       no 11. PREGNANCY: "Is there any chance you are pregnant?" "When was your last menstrual period?"       no  Protocols used: LEG SWELLING AND EDEMA-A-AH

## 2018-03-13 NOTE — Telephone Encounter (Addendum)
Pt has 30' appt with Dr Glori Bickers on 03/16/18 at 3:45. On appt notes has requested appt for Mon.

## 2018-03-15 NOTE — Telephone Encounter (Signed)
Will see her then 

## 2018-03-16 ENCOUNTER — Encounter: Payer: Self-pay | Admitting: Internal Medicine

## 2018-03-16 ENCOUNTER — Ambulatory Visit (INDEPENDENT_AMBULATORY_CARE_PROVIDER_SITE_OTHER): Payer: Medicare HMO | Admitting: Internal Medicine

## 2018-03-16 ENCOUNTER — Ambulatory Visit: Payer: Medicare HMO | Admitting: Family Medicine

## 2018-03-16 VITALS — BP 118/80 | HR 88 | Temp 97.6°F | Ht 61.0 in | Wt 177.0 lb

## 2018-03-16 DIAGNOSIS — R609 Edema, unspecified: Secondary | ICD-10-CM | POA: Insufficient documentation

## 2018-03-16 DIAGNOSIS — R600 Localized edema: Secondary | ICD-10-CM | POA: Diagnosis not present

## 2018-03-16 NOTE — Assessment & Plan Note (Signed)
Seems to be localized venous insufficiency related to change in diet and travel Discussed avoiding salt in food Compression stockings for prolonged travel or immobility

## 2018-03-16 NOTE — Progress Notes (Signed)
Subjective:    Patient ID: Theresa Huber, female    DOB: 06-02-1951, 67 y.o.   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 aspirin EC 81 MG tablet Take 81 mg by mouth daily.    . pantoprazole (PROTONIX) 40 MG tablet Take 1 tablet (40 mg total) by mouth daily. 90 tablet 3   No current facility-administered medications on file prior to visit.     Allergies  Allergen Reactions  . Shellfish Allergy Hives and Itching  . Ibandronate Sodium     REACTION: bad chest pain  . Sulfonamide Derivatives     REACTION: hives    Past Medical History:  Diagnosis Date  . GERD (gastroesophageal reflux disease)   . IBS (irritable bowel syndrome)   . Osteoporosis    T score -2.5 at hip in 2008    Past Surgical History:  Procedure Laterality Date  . ABDOMINAL HYSTERECTOMY    . CESAREAN SECTION  1983  . KNEE ARTHROSCOPY  1990   LEFT  . LIPOMA EXCISION  1980   LEFT THIGH    Family History  Problem Relation Age of Onset  . Diabetes Mother   . Heart disease Mother        ? A FIB  . Cancer Mother        ? sCHWANOMMA, SKIN CANCER  . Sleep apnea Mother   . Dementia Mother 53       Alzheimer's  . COPD Father   . Hyperlipidemia Father   . Sleep apnea Father   . Emphysema Father   . Mitral valve prolapse Sister   . Cancer Sister        MELANOMA    Social History    Socioeconomic History  . Marital status: Married    Spouse name: Not on file  . Number of children: 1  . Years of education: Not on file  . Highest education level: Not on file  Occupational History  . Occupation: Receiving    Employer: [employer]: Retired now  Scientific laboratory technician  . Financial resource strain: Not on file  . Food insecurity:    Worry: Not on file    Inability: Not on file  . Transportation needs:    Medical: Not on file    Non-medical: Not on file  Tobacco Use  . Smoking status: Former Smoker    Packs/day: 2.00    Years: 8.00    Pack years: 16.00    Types: Cigarettes    Last attempt to quit: 11/18/1984    Years since quitting: 33.3  . Smokeless tobacco: Never Used  Substance and Sexual Activity  . Alcohol use: Yes    Comment: occassionally  . Drug use: No  . Sexual activity: Not on file  Lifestyle  . Physical activity:  Days per week: Not on file    Minutes per session: Not on file  . Stress: Not on file  Relationships  . Social connections:    Talks on phone: Not on file    Gets together: Not on file    Attends religious service: Not on file    Active member of club or organization: Not on file    Attends meetings of clubs or organizations: Not on file    Relationship status: Not on file  . Intimate partner violence:    Fear of current or ex partner: Not on file    Emotionally abused: Not on file    Physically abused: Not on file    Forced sexual activity: Not on file  Other Topics Concern  . Not on file  Social History Narrative   Has living will   Husband is health care POA--considering changing to son   Would accept resuscitation   No tube feeds if cognitively unaware   Review of Systems Uncomfortable sleeping due to edema---sleeps on 2 pillows No PND (but can awaken choking)    Objective:   Physical Exam  Cardiovascular: Normal rate, regular rhythm, normal heart sounds and intact distal pulses. Exam reveals no friction rub.   No murmur heard. Pulmonary/Chest: Effort normal and breath sounds normal. No stridor. No respiratory distress. She has no wheezes. She has no rales.  Musculoskeletal: She exhibits no edema.  No sig venous disease visible          Assessment & Plan:

## 2018-05-06 ENCOUNTER — Ambulatory Visit (INDEPENDENT_AMBULATORY_CARE_PROVIDER_SITE_OTHER): Payer: Medicare HMO

## 2018-05-06 ENCOUNTER — Ambulatory Visit: Payer: Medicare HMO | Admitting: Podiatry

## 2018-05-06 ENCOUNTER — Encounter: Payer: Self-pay | Admitting: Podiatry

## 2018-05-06 DIAGNOSIS — M779 Enthesopathy, unspecified: Secondary | ICD-10-CM | POA: Diagnosis not present

## 2018-05-06 MED ORDER — TRIAMCINOLONE ACETONIDE 10 MG/ML IJ SUSP
10.0000 mg | Freq: Once | INTRAMUSCULAR | Status: AC
Start: 2018-05-06 — End: 2018-05-06
  Administered 2018-05-06: 10 mg

## 2018-05-06 NOTE — Progress Notes (Signed)
Subjective:   Patient ID: Theresa Huber, female   DOB: 67 y.o.   MRN: [id number]   HPI Patient presents stating she is having a lot of pain in the forefoot left and does not remember specific injury but it is hard to walk on.  Patient states is been going on for several weeks worse over the last week patient has good digital perfusion and patient does not smoke and likes to be active   Review of Systems  All other systems reviewed and are negative.       Objective:  Physical Exam  Constitutional: She appears well-developed and well-nourished.  Cardiovascular: Intact distal pulses.  Pulmonary/Chest: Effort normal.  Musculoskeletal: Normal range of motion.  Neurological: She is alert.  Skin: Skin is warm.  Nursing note and vitals reviewed.   Neurovascular status intact muscle strength adequate range of motion within normal limits with patient found to have exquisite discomfort second and third metatarsals left with fluid buildup around the metatarsal phalangeal joint of both joints.  Patient has good digital perfusion well oriented x3     Assessment:  Acute capsulitis second and third metatarsal phalangeal joints left     Plan:  H&P x-rays reviewed and today proximal nerve block administered.  Sterile block then done and I went ahead did sterile prep of the area aspirated the second and third MPJs getting out a small amount of clear fluid and injected quarter cc Dexasone Kenalog into each joint and applied padding.  Reappoint to recheck  X-ray indicates there is no indications of stroke structural pathology with no indication of stress fracture arthritis

## 2018-05-14 ENCOUNTER — Encounter: Payer: Self-pay | Admitting: Internal Medicine

## 2018-05-14 ENCOUNTER — Telehealth: Payer: Self-pay | Admitting: *Deleted

## 2018-05-14 DIAGNOSIS — Z0279 Encounter for issue of other medical certificate: Secondary | ICD-10-CM

## 2018-05-14 NOTE — Telephone Encounter (Signed)
Patient notified letter is ready for pick up and $20 charge.

## 2018-05-14 NOTE — Telephone Encounter (Signed)
Letter written $20 charge 

## 2018-05-14 NOTE — Telephone Encounter (Signed)
Copied from [place] [id number]. Topic: Inquiry >> May 14, 2018  8:40 AM Scherrie Gerlach wrote: Reason for CRM: pt would like a note to be excused from jury duty on August 26.  Pt states she does not drive. Also her husband Eduard Clos has onset alzheimers and not supposed to drive on his on. If he had to drive her, he would have to drive back home alone and that is not safe.  Pt states she can pick up the letter when ready

## 2018-05-15 ENCOUNTER — Ambulatory Visit (INDEPENDENT_AMBULATORY_CARE_PROVIDER_SITE_OTHER): Payer: Medicare HMO | Admitting: Internal Medicine

## 2018-05-15 ENCOUNTER — Encounter: Payer: Self-pay | Admitting: Internal Medicine

## 2018-05-15 VITALS — BP 102/60 | HR 87 | Temp 98.1°F | Ht 61.0 in | Wt 176.0 lb

## 2018-05-15 DIAGNOSIS — Z91038 Other insect allergy status: Secondary | ICD-10-CM | POA: Insufficient documentation

## 2018-05-15 MED ORDER — TRIAMCINOLONE ACETONIDE 0.1 % EX CREA: 1.0000 "application " | TOPICAL_CREAM | Freq: Two times a day (BID) | CUTANEOUS | 1 refills | Status: AC | PRN

## 2018-05-15 NOTE — Assessment & Plan Note (Signed)
Reassured---no secondary infection Discussed oral antihistamine Cortisone cream

## 2018-05-15 NOTE — Progress Notes (Signed)
Subjective:    Patient ID: Theresa Huber, female    DOB: 02-23-1951, 67 y.o.   MRN: [id number]  HPI Here due to skin problem  Has been breaking out in "itchy bumps" Has one on back--tried benedryl, neosporin, other antibiotic cream---not helping Still itchy and uncomfortable Has one on abdomen and also on right side a few weeks ago  Lots of insect exposures---ticks, mosquitos, etc Thinks she gets bit a lot Often finds ticks---doesn't know of any on for an extended time  Current Outpatient Medications on File Prior to Visit  Medication Sig Dispense Refill  . aspirin EC 81 MG tablet Take 81 mg by mouth daily.    . pantoprazole (PROTONIX) 40 MG tablet Take 1 tablet (40 mg total) by mouth daily. 90 tablet 3   No current facility-administered medications on file prior to visit.     Allergies  Allergen Reactions  . Shellfish Allergy Hives and Itching  . Ibandronate Sodium     REACTION: bad chest pain  . Sulfonamide Derivatives     REACTION: hives    Past Medical History:  Diagnosis Date  . GERD (gastroesophageal reflux disease)   . IBS (irritable bowel syndrome)   . Osteoporosis    T score -2.5 at hip in 2008    Past Surgical History:  Procedure Laterality Date  . ABDOMINAL HYSTERECTOMY    . CESAREAN SECTION  1983  . KNEE ARTHROSCOPY  1990   LEFT  . LIPOMA EXCISION  1980   LEFT THIGH    Family History  Problem Relation Age of Onset  . Diabetes Mother   . Heart disease Mother        ? A FIB  . Cancer Mother        ? sCHWANOMMA, SKIN CANCER  . Sleep apnea Mother   . Dementia Mother 44       Alzheimer's  . COPD Father   . Hyperlipidemia Father   . Sleep apnea Father   . Emphysema Father   . Mitral valve prolapse Sister   . Cancer Sister        MELANOMA    Social History   Socioeconomic History  . Marital status: Married    Spouse name: Not on file  . Number of children: 1  . Years of education: Not on file  . Highest education level: Not on  file  Occupational History  . Occupation: Receiving    Employer: [employer]: Retired now  Scientific laboratory technician  . Financial resource strain: Not on file  . Food insecurity:    Worry: Not on file    Inability: Not on file  . Transportation needs:    Medical: Not on file    Non-medical: Not on file  Tobacco Use  . Smoking status: Former Smoker    Packs/day: 2.00    Years: 8.00    Pack years: 16.00    Types: Cigarettes    Last attempt to quit: 11/18/1984    Years since quitting: 33.5  . Smokeless tobacco: Never Used  Substance and Sexual Activity  . Alcohol use: Yes    Comment: occassionally  . Drug use: No  . Sexual activity: Not on file  Lifestyle  . Physical activity:    Days per week: Not on file    Minutes per session: Not on file  . Stress: Not on file  Relationships  . Social connections:    Talks on phone: Not on file  Gets together: Not on file    Attends religious service: Not on file    Active member of club or organization: Not on file    Attends meetings of clubs or organizations: Not on file    Relationship status: Not on file  . Intimate partner violence:    Fear of current or ex partner: Not on file    Emotionally abused: Not on file    Physically abused: Not on file    Forced sexual activity: Not on file  Other Topics Concern  . Not on file  Social History Narrative   Has living will   Husband is health care POA--considering changing to son   Would accept resuscitation   No tube feeds if cognitively unaware   Review of Systems No fever Rare slight headache Doesn't feel sick    Objective:   Physical Exam  Constitutional: She appears well-developed. No distress.  Skin:  2 back lesions---with induration and slight redness 1 older one on abdomen---clearly bug bites           Assessment & Plan:

## 2018-05-15 NOTE — Patient Instructions (Signed)
Please try an over the counter antihistamine like cetirizine 10mg  or fexofenadine 180mg  daily for the itching.

## 2018-06-03 DIAGNOSIS — D2371 Other benign neoplasm of skin of right lower limb, including hip: Secondary | ICD-10-CM | POA: Diagnosis not present

## 2018-06-03 DIAGNOSIS — L719 Rosacea, unspecified: Secondary | ICD-10-CM | POA: Diagnosis not present

## 2018-06-03 DIAGNOSIS — Z808 Family history of malignant neoplasm of other organs or systems: Secondary | ICD-10-CM | POA: Diagnosis not present

## 2018-06-03 DIAGNOSIS — L986 Other infiltrative disorders of the skin and subcutaneous tissue: Secondary | ICD-10-CM | POA: Diagnosis not present

## 2018-06-03 DIAGNOSIS — R21 Rash and other nonspecific skin eruption: Secondary | ICD-10-CM | POA: Diagnosis not present

## 2018-06-03 DIAGNOSIS — L812 Freckles: Secondary | ICD-10-CM | POA: Diagnosis not present

## 2018-06-03 DIAGNOSIS — D485 Neoplasm of uncertain behavior of skin: Secondary | ICD-10-CM | POA: Diagnosis not present

## 2018-06-03 DIAGNOSIS — D225 Melanocytic nevi of trunk: Secondary | ICD-10-CM | POA: Diagnosis not present

## 2018-06-03 DIAGNOSIS — Z1283 Encounter for screening for malignant neoplasm of skin: Secondary | ICD-10-CM | POA: Diagnosis not present

## 2018-07-15 DIAGNOSIS — R69 Illness, unspecified: Secondary | ICD-10-CM | POA: Diagnosis not present

## 2018-07-15 DIAGNOSIS — L719 Rosacea, unspecified: Secondary | ICD-10-CM | POA: Diagnosis not present

## 2018-07-15 DIAGNOSIS — L710 Perioral dermatitis: Secondary | ICD-10-CM | POA: Diagnosis not present

## 2018-07-17 ENCOUNTER — Telehealth: Payer: Self-pay | Admitting: Internal Medicine

## 2018-07-17 NOTE — Telephone Encounter (Signed)
Spoke to Theresa Huber. Advised her that Theresa Huber could contact [facility] and schedule her own mammogram. If Theresa Huber was having an issue, Theresa Huber would need to see Dr Silvio Pate, first. Theresa Huber said Theresa Huber was normal and would call and schedule her mammogram.

## 2018-07-17 NOTE — Telephone Encounter (Signed)
Copied from [place] [id number]. Topic: Quick Communication - See Telephone Encounter >> Jul 17, 2018 11:57 AM Rutherford Nail, NT wrote: CRM for notification. See Telephone encounter for: 07/17/18. Patient calling and is requesting a mammogram. Please advise.

## 2018-07-21 ENCOUNTER — Other Ambulatory Visit: Payer: Self-pay | Admitting: Internal Medicine

## 2018-07-21 DIAGNOSIS — Z1231 Encounter for screening mammogram for malignant neoplasm of breast: Secondary | ICD-10-CM

## 2018-07-31 ENCOUNTER — Ambulatory Visit
Admission: RE | Admit: 2018-07-31 | Discharge: 2018-07-31 | Disposition: A | Discharge status: Home / Self Care | Payer: Medicare HMO | Source: Ambulatory Visit | Attending: Internal Medicine | Admitting: Internal Medicine

## 2018-07-31 DIAGNOSIS — Z1231 Encounter for screening mammogram for malignant neoplasm of breast: Secondary | ICD-10-CM | POA: Diagnosis not present

## 2018-09-17 ENCOUNTER — Encounter: Payer: Medicare HMO | Admitting: Internal Medicine

## 2018-09-23 ENCOUNTER — Encounter: Payer: Medicare HMO | Admitting: Internal Medicine

## 2018-09-28 ENCOUNTER — Ambulatory Visit (INDEPENDENT_AMBULATORY_CARE_PROVIDER_SITE_OTHER): Payer: Medicare HMO | Admitting: Internal Medicine

## 2018-09-28 ENCOUNTER — Encounter: Payer: Self-pay | Admitting: Internal Medicine

## 2018-09-28 VITALS — BP 126/78 | HR 84 | Temp 97.8°F | Ht 61.75 in | Wt 179.5 lb

## 2018-09-28 DIAGNOSIS — R600 Localized edema: Secondary | ICD-10-CM | POA: Diagnosis not present

## 2018-09-28 DIAGNOSIS — K2100 Gastro-esophageal reflux disease with esophagitis, without bleeding: Secondary | ICD-10-CM

## 2018-09-28 DIAGNOSIS — Z0000 Encounter for general adult medical examination without abnormal findings: Secondary | ICD-10-CM

## 2018-09-28 DIAGNOSIS — E785 Hyperlipidemia, unspecified: Secondary | ICD-10-CM

## 2018-09-28 DIAGNOSIS — M810 Age-related osteoporosis without current pathological fracture: Secondary | ICD-10-CM

## 2018-09-28 DIAGNOSIS — Z7189 Other specified counseling: Secondary | ICD-10-CM

## 2018-09-28 LAB — COMPREHENSIVE METABOLIC PANEL
ALBUMIN: 4.1 g/dL (ref 3.5–5.2)
ALK PHOS: 128 U/L — AB (ref 39–117)
ALT: 24 U/L (ref 0–35)
AST: 23 U/L (ref 0–37)
BUN: 16 mg/dL (ref 6–23)
CO2: 30 mEq/L (ref 19–32)
CREATININE: 0.82 mg/dL (ref 0.40–1.20)
Calcium: 9.8 mg/dL (ref 8.4–10.5)
Chloride: 104 mEq/L (ref 96–112)
GFR: 74.12 mL/min (ref >60.00)
Glucose, Bld: 98 mg/dL (ref 70–99)
POTASSIUM: 4.4 meq/L (ref 3.5–5.1)
SODIUM: 140 meq/L (ref 135–145)
TOTAL PROTEIN: 7.5 g/dL (ref 6.0–8.3)
Total Bilirubin: 0.6 mg/dL (ref 0.2–1.2)

## 2018-09-28 LAB — LIPID PANEL
CHOL/HDL RATIO: 5
Cholesterol: 229 mg/dL — ABNORMAL HIGH (ref 0–200)
HDL: 47 mg/dL (ref >39.00)
LDL Cholesterol: 143 mg/dL — ABNORMAL HIGH (ref 0–99)
NONHDL: 181.72
Triglycerides: 195 mg/dL — ABNORMAL HIGH (ref 0.0–149.0)
VLDL: 39 mg/dL (ref 0.0–40.0)

## 2018-09-28 LAB — CBC
HEMATOCRIT: 42.3 % (ref 36.0–46.0)
HEMOGLOBIN: 13.9 g/dL (ref 12.0–15.0)
MCHC: 32.8 g/dL (ref 30.0–36.0)
MCV: 85.6 fl (ref 78.0–100.0)
PLATELETS: 273 10*3/uL (ref 150.0–400.0)
RBC: 4.94 Mil/uL (ref 3.87–5.11)
RDW: 15.3 % (ref 11.5–15.5)
WBC: 12.7 10*3/uL — AB (ref 4.0–10.5)

## 2018-09-28 LAB — T4, FREE: Free T4: 0.78 ng/dL (ref 0.60–1.60)

## 2018-09-28 NOTE — Patient Instructions (Signed)
DASH Eating Plan DASH stands for "Dietary Approaches to Stop Hypertension." The DASH eating plan is a healthy eating plan that has been shown to reduce high blood pressure (hypertension). It may also reduce your risk for type 2 diabetes, heart disease, and stroke. The DASH eating plan may also help with weight loss. What are tips for following this plan? General guidelines  Avoid eating more than 2,300 mg (milligrams) of salt (sodium) a day. If you have hypertension, you may need to reduce your sodium intake to 1,500 mg a day.  Limit alcohol intake to no more than 1 drink a day for nonpregnant women and 2 drinks a day for men. One drink equals 12 oz of beer, 5 oz of wine, or 1 oz of hard liquor.  Work with your health care provider to maintain a healthy body weight or to lose weight. Ask what an ideal weight is for you.  Get at least 30 minutes of exercise that causes your heart to beat faster (aerobic exercise) most days of the week. Activities may include walking, swimming, or biking.  Work with your health care provider or diet and nutrition specialist (dietitian) to adjust your eating plan to your individual calorie needs. Reading food labels  Check food labels for the amount of sodium per serving. Choose foods with less than 5 percent of the Daily Value of sodium. Generally, foods with less than 300 mg of sodium per serving fit into this eating plan.  To find whole grains, look for the word "whole" as the first word in the ingredient list. Shopping  Buy products labeled as "low-sodium" or "no salt added."  Buy fresh foods. Avoid canned foods and premade or frozen meals. Cooking  Avoid adding salt when cooking. Use salt-free seasonings or herbs instead of table salt or sea salt. Check with your health care provider or pharmacist before using salt substitutes.  Do not fry foods. Cook foods using healthy methods such as baking, boiling, grilling, and broiling instead.  Cook with  heart-healthy oils, such as olive, canola, soybean, or sunflower oil. Meal planning   Eat a balanced diet that includes: ? 5 or more servings of fruits and vegetables each day. At each meal, try to fill half of your plate with fruits and vegetables. ? Up to 6-8 servings of whole grains each day. ? Less than 6 oz of lean meat, poultry, or fish each day. A 3-oz serving of meat is about the same size as a deck of cards. One egg equals 1 oz. ? 2 servings of low-fat dairy each day. ? A serving of nuts, seeds, or beans 5 times each week. ? Heart-healthy fats. Healthy fats called Omega-3 fatty acids are found in foods such as flaxseeds and coldwater fish, like sardines, salmon, and mackerel.  Limit how much you eat of the following: ? Canned or prepackaged foods. ? Food that is high in trans fat, such as fried foods. ? Food that is high in saturated fat, such as fatty meat. ? Sweets, desserts, sugary drinks, and other foods with added sugar. ? Full-fat dairy products.  Do not salt foods before eating.  Try to eat at least 2 vegetarian meals each week.  Eat more home-cooked food and less restaurant, buffet, and fast food.  When eating at a restaurant, ask that your food be prepared with less salt or no salt, if possible. What foods are recommended? The items listed may not be a complete list. Talk with your dietitian about what   dietary choices are best for you. Grains Whole-grain or whole-wheat bread. Whole-grain or whole-wheat pasta. Brown rice. Oatmeal. Quinoa. Bulgur. Whole-grain and low-sodium cereals. Pita bread. Low-fat, low-sodium crackers. Whole-wheat flour tortillas. Vegetables Fresh or frozen vegetables (raw, steamed, roasted, or grilled). Low-sodium or reduced-sodium tomato and vegetable juice. Low-sodium or reduced-sodium tomato sauce and tomato paste. Low-sodium or reduced-sodium canned vegetables. Fruits All fresh, dried, or frozen fruit. Canned fruit in natural juice (without  added sugar). Meat and other protein foods Skinless chicken or turkey. Ground chicken or turkey. Pork with fat trimmed off. Fish and seafood. Egg whites. Dried beans, peas, or lentils. Unsalted nuts, nut butters, and seeds. Unsalted canned beans. Lean cuts of beef with fat trimmed off. Low-sodium, lean deli meat. Dairy Low-fat (1%) or fat-free (skim) milk. Fat-free, low-fat, or reduced-fat cheeses. Nonfat, low-sodium ricotta or cottage cheese. Low-fat or nonfat yogurt. Low-fat, low-sodium cheese. Fats and oils Soft margarine without trans fats. Vegetable oil. Low-fat, reduced-fat, or light mayonnaise and salad dressings (reduced-sodium). Canola, safflower, olive, soybean, and sunflower oils. Avocado. Seasoning and other foods Herbs. Spices. Seasoning mixes without salt. Unsalted popcorn and pretzels. Fat-free sweets. What foods are not recommended? The items listed may not be a complete list. Talk with your dietitian about what dietary choices are best for you. Grains Baked goods made with fat, such as croissants, muffins, or some breads. Dry pasta or rice meal packs. Vegetables Creamed or fried vegetables. Vegetables in a cheese sauce. Regular canned vegetables (not low-sodium or reduced-sodium). Regular canned tomato sauce and paste (not low-sodium or reduced-sodium). Regular tomato and vegetable juice (not low-sodium or reduced-sodium). Pickles. Olives. Fruits Canned fruit in a light or heavy syrup. Fried fruit. Fruit in cream or butter sauce. Meat and other protein foods Fatty cuts of meat. Ribs. Fried meat. Bacon. Sausage. Bologna and other processed lunch meats. Salami. Fatback. Hotdogs. Bratwurst. Salted nuts and seeds. Canned beans with added salt. Canned or smoked fish. Whole eggs or egg yolks. Chicken or turkey with skin. Dairy Whole or 2% milk, cream, and half-and-half. Whole or full-fat cream cheese. Whole-fat or sweetened yogurt. Full-fat cheese. Nondairy creamers. Whipped toppings.  Processed cheese and cheese spreads. Fats and oils Butter. Stick margarine. Lard. Shortening. Ghee. Bacon fat. Tropical oils, such as coconut, palm kernel, or palm oil. Seasoning and other foods Salted popcorn and pretzels. Onion salt, garlic salt, seasoned salt, table salt, and sea salt. Worcestershire sauce. Tartar sauce. Barbecue sauce. Teriyaki sauce. Soy sauce, including reduced-sodium. Steak sauce. Canned and packaged gravies. Fish sauce. Oyster sauce. Cocktail sauce. Horseradish that you find on the shelf. Ketchup. Mustard. Meat flavorings and tenderizers. Bouillon cubes. Hot sauce and Tabasco sauce. Premade or packaged marinades. Premade or packaged taco seasonings. Relishes. Regular salad dressings. Where to find more information:  National Heart, Lung, and Blood Institute: www.nhlbi.nih.gov  American Heart Association: www.heart.org Summary  The DASH eating plan is a healthy eating plan that has been shown to reduce high blood pressure (hypertension). It may also reduce your risk for type 2 diabetes, heart disease, and stroke.  With the DASH eating plan, you should limit salt (sodium) intake to 2,300 mg a day. If you have hypertension, you may need to reduce your sodium intake to 1,500 mg a day.  When on the DASH eating plan, aim to eat more fresh fruits and vegetables, whole grains, lean proteins, low-fat dairy, and heart-healthy fats.  Work with your health care provider or diet and nutrition specialist (dietitian) to adjust your eating plan to your individual   calorie needs. This information is not intended to replace advice given to you by your health care provider. Make sure you discuss any questions you have with your health care provider. Document Released: 10/24/2011 Document Revised: 10/28/2016 Document Reviewed: 10/28/2016 Elsevier Interactive Patient Education  2018 Elsevier Inc.  

## 2018-09-28 NOTE — Assessment & Plan Note (Signed)
Seems to be venous insufficiency  Discussed support hose Check labs Nothing to suggest CHF (DOE seems fitness related)

## 2018-09-28 NOTE — Assessment & Plan Note (Signed)
Discussed avoiding eating at bedtime On PPI

## 2018-09-28 NOTE — Assessment & Plan Note (Signed)
Not excited about Rx Will recheck

## 2018-09-28 NOTE — Assessment & Plan Note (Signed)
See social history 

## 2018-09-28 NOTE — Assessment & Plan Note (Signed)
I have personally reviewed the Medicare Annual Wellness questionnaire and have noted 1. The patient's medical and social history 2. Their use of alcohol, tobacco or illicit drugs 3. Their current medications and supplements 4. The patient's functional ability including ADL's, fall risks, home safety risks and hearing or visual             impairment. 5. Diet and physical activities 6. Evidence for depression or mood disorders  The patients weight, height, BMI and visual acuity have been recorded in the chart I have made referrals, counseling and provided education to the patient based review of the above and I have provided the pt with a written personalized care plan for preventive services.  I have provided you with a copy of your personalized plan for preventive services. Please take the time to review along with your updated medication list.  Discussed exercise/DASH eating Recommended shingrix--she will look into it Mammogram 1-2 years Colon due 11/20 Had flu vaccine

## 2018-09-28 NOTE — Progress Notes (Signed)
Subjective:    Patient ID: Theresa Huber, female    DOB: [birth year], 67 y.o.   MRN: [id number]  HPI Here for Medicare wellness visit and follow up of chronic health conditions Reviewed form and advanced directives Reviewed other doctors Walks about a mile twice a day with dog Occasional wine or cooler drink No tobacco No falls No depression or anhedonia Vision and hearing seem to be fine Independent with instrumental ADLs Mild Theresa issues---misplaces things  Did have mammogram --but had dense breast tissue so worried  Reviewed report--normal Reassured---I would not take other action (other than repeat in 1-2 years)  Concerned about swelling in feet Especially during recent trip to [place] up to ankle Fine in the morning---just if walking a lot or stationery No foot pain or ulcers Doesn't use salt  Walks dog regularly---harder to do now DOE easier No chest pain No palpitations No dizziness or syncope  Chronic heartburn Continues on PPI daily No dysphagia  Reviewed the cholesterol levels Prefers no meds "unless I have to"  Current Outpatient Medications on File Prior to Visit  Medication Sig Dispense Refill  . aspirin EC 81 MG tablet Take 81 mg by mouth daily.    . pantoprazole (PROTONIX) 40 MG tablet Take 1 tablet (40 mg total) by mouth daily. 90 tablet 3  . triamcinolone cream (KENALOG) 0.1 % Apply 1 application topically 2 (two) times daily as needed. 45 g 1   No current facility-administered medications on file prior to visit.     Allergies  Allergen Reactions  . Shellfish Allergy Hives and Itching  . Ibandronate Sodium     REACTION: bad chest pain  . Sulfonamide Derivatives     REACTION: hives    Past Medical History:  Diagnosis Date  . GERD (gastroesophageal reflux disease)   . IBS (irritable bowel syndrome)   . Osteoporosis    T score -2.5 at hip in 2008    Past Surgical History:  Procedure Laterality Date  . ABDOMINAL  HYSTERECTOMY    . CESAREAN SECTION  1983  . KNEE ARTHROSCOPY  1990   LEFT  . LIPOMA EXCISION  1980   LEFT THIGH  . OOPHORECTOMY      Family History  Problem Relation Age of Onset  . Diabetes Mother   . Heart disease Mother        ? A FIB  . Cancer Mother        ? sCHWANOMMA, SKIN CANCER  . Sleep apnea Mother   . Dementia Mother 49       Alzheimer's  . Stroke Mother   . Atrial fibrillation Mother   . COPD Father   . Hyperlipidemia Father   . Sleep apnea Father   . Emphysema Father   . Mitral valve prolapse Sister   . Cancer Sister        MELANOMA    Social History   Socioeconomic History  . Marital status: Married    Spouse name: Not on file  . Number of children: 1  . Years of education: Not on file  . Highest education level: Not on file  Occupational History  . Occupation: Receiving    Employer: [employer]: Retired now  Scientific laboratory technician  . Financial resource strain: Not on file  . Food insecurity:    Worry: Not on file    Inability: Not on file  . Transportation needs:    Medical: Not on file    Non-medical:  Not on file  Tobacco Use  . Smoking status: Former Smoker    Packs/day: 2.00    Years: 8.00    Pack years: 16.00    Types: Cigarettes    Last attempt to quit: 11/18/1984    Years since quitting: 33.8  . Smokeless tobacco: Never Used  Substance and Sexual Activity  . Alcohol use: Yes    Comment: occassionally  . Drug use: No  . Sexual activity: Not on file  Lifestyle  . Physical activity:    Days per week: Not on file    Minutes per session: Not on file  . Stress: Not on file  Relationships  . Social connections:    Talks on phone: Not on file    Gets together: Not on file    Attends religious service: Not on file    Active member of club or organization: Not on file    Attends meetings of clubs or organizations: Not on file    Relationship status: Not on file  . Intimate partner violence:    Fear of current or ex partner: Not on  file    Emotionally abused: Not on file    Physically abused: Not on file    Forced sexual activity: Not on file  Other Topics Concern  . Not on file  Social History Narrative   Has living will   No current health care POA---due to his dementia. Considering changing to son   Would accept resuscitation   No tube feeds if cognitively unaware   Review of Systems Sleeps okay in bed. Uses 2 pillows---no PND Awakens choking occasionally ---nothing new. Inconclusive sleep study in the past Awakens refreshed--- no daytime somnolence Likes bedtime snack--discussed  Wears seat belt Bowels are good with [place]. No blood No dysuria or hematuria. Urgency but no incontinence Diagnosed with rosacea.  Teeth okay---- keeps up with dentist    Objective:   Physical Exam  Constitutional: She is oriented to person, place, and time. She appears well-developed. No distress.  HENT:  Mouth/Throat: Oropharynx is clear and moist. No oropharyngeal exudate.  Neck: No thyromegaly present.  Cardiovascular: Normal rate, regular rhythm, normal heart sounds and intact distal pulses. Exam reveals no gallop.  No murmur heard. Respiratory: Effort normal and breath sounds normal. No respiratory distress. She has no wheezes. She has no rales.  GI: Soft. There is no tenderness.  Musculoskeletal:  Calves full but no edema  Lymphadenopathy:    She has no cervical adenopathy.  Neurological: She is alert and oriented to person, place, and time.  President--- "Dwaine Deter, [place]----?" [id number] Recall 3/3  Skin: No rash noted. No erythema.  Psychiatric: She has a normal mood and affect. Her behavior is normal.           Assessment & Plan:

## 2018-09-28 NOTE — Assessment & Plan Note (Signed)
On multivitamin Consider repeat DEXA as she gets closer to 54

## 2018-09-30 ENCOUNTER — Telehealth: Payer: Self-pay | Admitting: Internal Medicine

## 2018-09-30 NOTE — Telephone Encounter (Signed)
Pt has some questions concerning some high reading on her test rest. What causes her alkaline phosphatase to be high. Please advise

## 2018-09-30 NOTE — Telephone Encounter (Signed)
It is usually related to problems with bones, or liver. The mild elevation that she had is not unusual and is unlikely to represent any serious process---that is why I would not take any action about it

## 2018-09-30 NOTE — Telephone Encounter (Signed)
Spoke to pt. She appreciated the information.

## 2018-11-09 DIAGNOSIS — H00022 Hordeolum internum right lower eyelid: Secondary | ICD-10-CM | POA: Diagnosis not present

## 2018-11-23 ENCOUNTER — Telehealth: Payer: Self-pay

## 2018-11-23 DIAGNOSIS — M810 Age-related osteoporosis without current pathological fracture: Secondary | ICD-10-CM

## 2018-11-23 NOTE — Telephone Encounter (Signed)
Order placed for DEXA at pt's request.

## 2018-11-24 ENCOUNTER — Telehealth: Payer: Self-pay | Admitting: Internal Medicine

## 2018-11-24 NOTE — Telephone Encounter (Signed)
Bone Density ordered  by Larene Beach and scheduled for February 2020., patient notified.

## 2018-11-24 NOTE — Telephone Encounter (Signed)
-----   Message from Venia Carbon, MD sent at 11/16/2018  1:21 PM EST ----- Please check with her about this and see if we can get it scheduled. Thanks! ----- Message ----- From: SYSTEM Sent: 11/16/2018  12:09 AM EST To: Venia Carbon, MD

## 2018-11-30 ENCOUNTER — Ambulatory Visit (INDEPENDENT_AMBULATORY_CARE_PROVIDER_SITE_OTHER): Payer: Medicare HMO | Admitting: Internal Medicine

## 2018-11-30 ENCOUNTER — Encounter: Payer: Self-pay | Admitting: Internal Medicine

## 2018-11-30 VITALS — BP 122/78 | HR 76 | Temp 98.0°F | Ht 62.0 in | Wt 179.0 lb

## 2018-11-30 DIAGNOSIS — E785 Hyperlipidemia, unspecified: Secondary | ICD-10-CM | POA: Diagnosis not present

## 2018-11-30 DIAGNOSIS — R7309 Other abnormal glucose: Secondary | ICD-10-CM | POA: Diagnosis not present

## 2018-11-30 MED ORDER — ATORVASTATIN CALCIUM 20 MG PO TABS: 20.0000 mg | ORAL_TABLET | Freq: Every day | ORAL | 3 refills | Status: AC

## 2018-11-30 NOTE — Progress Notes (Signed)
Subjective:    Patient ID: Theresa Huber, female    DOB: February 21, 1951, 68 y.o.   MRN: [id number]  HPI Here due to concerns about [facility] screening results  Reviewed her results Most concerned about her CRP---discussed that this is a proxy for cardiovascular risk Cholesterol still mildly elevated Sugar was 198---doesn't remember if she was fasting  Most concerned about dementia--given what her husband is going through  Current Outpatient Medications on File Prior to Visit  Medication Sig Dispense Refill  . aspirin EC 81 MG tablet Take 81 mg by mouth daily.    . pantoprazole (PROTONIX) 40 MG tablet Take 1 tablet (40 mg total) by mouth daily. 90 tablet 3  . triamcinolone cream (KENALOG) 0.1 % Apply 1 application topically 2 (two) times daily as needed. 45 g 1   No current facility-administered medications on file prior to visit.     Allergies  Allergen Reactions  . Shellfish Allergy Hives and Itching  . Ibandronate Sodium     REACTION: bad chest pain  . Sulfonamide Derivatives     REACTION: hives    Past Medical History:  Diagnosis Date  . GERD (gastroesophageal reflux disease)   . IBS (irritable bowel syndrome)   . Osteoporosis    T score -2.5 at hip in 2008    Past Surgical History:  Procedure Laterality Date  . ABDOMINAL HYSTERECTOMY    . CESAREAN SECTION  1983  . KNEE ARTHROSCOPY  1990   LEFT  . LIPOMA EXCISION  1980   LEFT THIGH  . OOPHORECTOMY      Family History  Problem Relation Age of Onset  . Diabetes Mother   . Heart disease Mother        ? A FIB  . Cancer Mother        ? sCHWANOMMA, SKIN CANCER  . Sleep apnea Mother   . Dementia Mother 41       Alzheimer's  . Stroke Mother   . Atrial fibrillation Mother   . COPD Father   . Hyperlipidemia Father   . Sleep apnea Father   . Emphysema Father   . Mitral valve prolapse Sister   . Cancer Sister        MELANOMA    Social History   Socioeconomic History  . Marital status: Married   Spouse name: Not on file  . Number of children: 1  . Years of education: Not on file  . Highest education level: Not on file  Occupational History  . 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 Financial resource strain: Not on file  . Food insecurity:    Worry: Not on file    Inability: Not on file  . Transportation needs:    Medical: Not on file    Non-medical: Not on file  Tobacco Use  . Smoking status: Former Smoker    Packs/day: 2.00    Years: 8.00    Pack years: 16.00    Types: Cigarettes    Last attempt to quit: 11/18/1984    Years since quitting: 34.0  . Smokeless tobacco: Never Used  Substance and Sexual Activity  . Alcohol use: Yes    Comment: occassionally  . Drug use: No  . Sexual activity: Not on file  Lifestyle  . Physical activity:    Days per week: Not on file    Minutes per session: Not on file  . Stress: Not on  file  Relationships  . Social connections:    Talks on phone: Not on file    Gets together: Not on file    Attends religious service: Not on file    Active member of club or organization: Not on file    Attends meetings of clubs or organizations: Not on file    Relationship status: Not on file  . Intimate partner violence:    Fear of current or ex partner: Not on file    Emotionally abused: Not on file    Physically abused: Not on file    Forced sexual activity: Not on file  Other Topics Concern  . Not on file  Social History Narrative   Has living will   No current health care POA---due to his dementia. Considering changing to son   Would accept resuscitation   No tube feeds if cognitively unaware   Review of Systems BP at home by husband was elevated At [facility] 153/?? Fine here    Objective:   Physical Exam  Constitutional: She appears well-developed. No distress.  Psychiatric: She has a normal mood and affect. Her behavior is normal.           Assessment & Plan:

## 2018-11-30 NOTE — Patient Instructions (Signed)
Please check with [facility] about the pneumovax vaccine

## 2018-11-30 NOTE — Assessment & Plan Note (Signed)
Scared by the [facility] screening  Discussed my concerns about it being largely inappropriate 15 minute counseling about risks/benefits of statins Will try moderate dose and see if tolerates

## 2018-11-30 NOTE — Assessment & Plan Note (Signed)
Will recheck this Was okay here on her last visit

## 2018-12-28 ENCOUNTER — Other Ambulatory Visit (INDEPENDENT_AMBULATORY_CARE_PROVIDER_SITE_OTHER): Payer: Medicare HMO

## 2018-12-28 DIAGNOSIS — E785 Hyperlipidemia, unspecified: Secondary | ICD-10-CM

## 2018-12-28 DIAGNOSIS — R7309 Other abnormal glucose: Secondary | ICD-10-CM | POA: Diagnosis not present

## 2018-12-28 LAB — COMPREHENSIVE METABOLIC PANEL
ALK PHOS: 132 U/L — AB (ref 39–117)
ALT: 17 U/L (ref 0–35)
AST: 16 U/L (ref 0–37)
Albumin: 4 g/dL (ref 3.5–5.2)
BUN: 26 mg/dL — ABNORMAL HIGH (ref 6–23)
CO2: 29 meq/L (ref 19–32)
Calcium: 9.2 mg/dL (ref 8.4–10.5)
Chloride: 105 mEq/L (ref 96–112)
Creatinine, Ser: 0.87 mg/dL (ref 0.40–1.20)
GFR: 65.08 mL/min (ref >60.00)
GLUCOSE: 158 mg/dL — AB (ref 70–99)
POTASSIUM: 4.3 meq/L (ref 3.5–5.1)
Sodium: 142 mEq/L (ref 135–145)
Total Bilirubin: 0.5 mg/dL (ref 0.2–1.2)
Total Protein: 7.1 g/dL (ref 6.0–8.3)

## 2018-12-28 LAB — LIPID PANEL
CHOL/HDL RATIO: 3
Cholesterol: 146 mg/dL (ref 0–200)
HDL: 44.7 mg/dL (ref >39.00)
LDL Cholesterol: 69 mg/dL (ref 0–99)
NonHDL: 101.28
Triglycerides: 161 mg/dL — ABNORMAL HIGH (ref 0.0–149.0)
VLDL: 32.2 mg/dL (ref 0.0–40.0)

## 2018-12-28 LAB — HEMOGLOBIN A1C: Hgb A1c MFr Bld: 5.9 % (ref 4.6–6.5)

## 2018-12-29 ENCOUNTER — Telehealth: Payer: Self-pay

## 2018-12-29 NOTE — Telephone Encounter (Signed)
Pt needs a 3 month follow-up to see how she is doing with lifestyle changes in regards to her recent labs. Thanks

## 2018-12-29 NOTE — Telephone Encounter (Signed)
Patient is moving to [place] on 02/17/19.  I scheduled patient to see Dr.Letvak before she leaves on 02/10/19.

## 2018-12-31 ENCOUNTER — Ambulatory Visit
Admission: RE | Admit: 2018-12-31 | Discharge: 2018-12-31 | Disposition: A | Discharge status: Home / Self Care | Payer: Medicare HMO | Source: Ambulatory Visit | Attending: Internal Medicine | Admitting: Internal Medicine

## 2018-12-31 DIAGNOSIS — M810 Age-related osteoporosis without current pathological fracture: Secondary | ICD-10-CM

## 2019-02-02 ENCOUNTER — Other Ambulatory Visit: Payer: Self-pay | Admitting: Internal Medicine

## 2019-02-03 ENCOUNTER — Encounter: Payer: Self-pay | Admitting: Family Medicine

## 2019-02-03 ENCOUNTER — Other Ambulatory Visit: Payer: Self-pay

## 2019-02-03 ENCOUNTER — Telehealth: Payer: Self-pay

## 2019-02-03 ENCOUNTER — Ambulatory Visit (INDEPENDENT_AMBULATORY_CARE_PROVIDER_SITE_OTHER): Payer: Medicare HMO | Admitting: Family Medicine

## 2019-02-03 ENCOUNTER — Ambulatory Visit: Payer: Medicare HMO | Admitting: Internal Medicine

## 2019-02-03 VITALS — BP 120/70 | HR 101 | Temp 99.3°F | Ht 63.0 in | Wt 173.0 lb

## 2019-02-03 DIAGNOSIS — R059 Cough, unspecified: Secondary | ICD-10-CM

## 2019-02-03 DIAGNOSIS — J101 Influenza due to other identified influenza virus with other respiratory manifestations: Secondary | ICD-10-CM

## 2019-02-03 DIAGNOSIS — R05 Cough: Secondary | ICD-10-CM | POA: Diagnosis not present

## 2019-02-03 LAB — POC INFLUENZA A&B (BINAX/QUICKVUE)
Influenza A, POC: POSITIVE — AB
Influenza B, POC: NEGATIVE

## 2019-02-03 MED ORDER — OSELTAMIVIR PHOSPHATE 75 MG PO CAPS: 75.0000 mg | ORAL_CAPSULE | Freq: Two times a day (BID) | ORAL | 0 refills | Status: AC

## 2019-02-03 MED ORDER — BENZONATATE 100 MG PO CAPS: 100.0000 mg | ORAL_CAPSULE | Freq: Three times a day (TID) | ORAL | 0 refills | Status: AC | PRN

## 2019-02-03 NOTE — Telephone Encounter (Signed)
Pt said started 2 days ago with chest congestion, raspy,H/A,prod cough with yellow phlegm; ? Fever pt does not have thermometer.pt said chest hurts on both sides when pt coughs. No wheeze or SOB. Pt has not traveled  And no known exposure to covid or flu. Pt has been taking Tylenol and mucinex for 2 days. Pt scheduled 30' appt 02/03/19 at 4:00 with Glenda Chroman FNP.

## 2019-02-03 NOTE — Patient Instructions (Addendum)
I'm sorry you are sick, I hope you feel better soon! I have sent Tamiflu and cough pills to your pharmacy Get lots of liquids and rest   Influenza, Adult Influenza is also called "the flu." It is an infection in the lungs, nose, and throat (respiratory tract). It is caused by a virus. The flu causes symptoms that are similar to symptoms of a cold. It also causes a high fever and body aches. The flu spreads easily from person to person (is contagious). Getting a flu shot (influenza vaccination) every year is the best way to prevent the flu. What are the causes? This condition is caused by the influenza virus. You can get the virus by:  Breathing in droplets that are in the air from the cough or sneeze of a person who has the virus.  Touching something that has the virus on it (is contaminated) and then touching your mouth, nose, or eyes. What increases the risk? Certain things may make you more likely to get the flu. These include:  Not washing your hands often.  Having close contact with many people during cold and flu season.  Touching your mouth, eyes, or nose without first washing your hands.  Not getting a flu shot every year. You may have a higher risk for the flu, along with serious problems such as a lung infection (pneumonia), if you:  Are older than 65.  Are pregnant.  Have a weakened disease-fighting system (immune system) because of a disease or taking certain medicines.  Have a long-term (chronic) illness, such as: ? Heart, kidney, or lung disease. ? Diabetes. ? Asthma.  Have a liver disorder.  Are very overweight (morbidly obese).  Have anemia. This is a condition that affects your red blood cells. What are the signs or symptoms? Symptoms usually begin suddenly and last 4-14 days. They may include:  Fever and chills.  Headaches, body aches, or muscle aches.  Sore throat.  Cough.  Runny or stuffy (congested) nose.  Chest discomfort.  Not wanting to  eat as much as normal (poor appetite).  Weakness or feeling tired (fatigue).  Dizziness.  Feeling sick to your stomach (nauseous) or throwing up (vomiting). How is this treated? If the flu is found early, you can be treated with medicine that can help reduce how bad the illness is and how long it lasts (antiviral medicine). This may be given by mouth (orally) or through an IV tube. Taking care of yourself at home can help your symptoms get better. Your doctor may suggest:  Taking over-the-counter medicines.  Drinking plenty of fluids. The flu often goes away on its own. If you have very bad symptoms or other problems, you may be treated in a hospital. Follow these instructions at home:     Activity  Rest as needed. Get plenty of sleep.  Stay home from work or school as told by your doctor. ? Do not leave home until you do not have a fever for 24 hours without taking medicine. ? Leave home only to visit your doctor. Eating and drinking  Take an ORS (oral rehydration solution). This is a drink that is sold at pharmacies and stores.  Drink enough fluid to keep your pee (urine) pale yellow.  Drink clear fluids in small amounts as you are able. Clear fluids include: ? Water. ? Ice chips. ? Fruit juice that has water added (diluted fruit juice). ? Low-calorie sports drinks.  Eat bland, easy-to-digest foods in small amounts as you are  able. These foods include: ? Bananas. ? Applesauce. ? Rice. ? Lean meats. ? Toast. ? Crackers.  Do not eat or drink: ? Fluids that have a lot of sugar or caffeine. ? Alcohol. ? Spicy or fatty foods. General instructions  Take over-the-counter and prescription medicines only as told by your doctor.  Use a cool mist humidifier to add moisture to the air in your home. This can make it easier for you to breathe.  Cover your mouth and nose when you cough or sneeze.  Wash your hands with soap and water often, especially after you cough or  sneeze. If you cannot use soap and water, use alcohol-based hand sanitizer.  Keep all follow-up visits as told by your doctor. This is important. How is this prevented?   Get a flu shot every year. You may get the flu shot in late summer, fall, or winter. Ask your doctor when you should get your flu shot.  Avoid contact with people who are sick during fall and winter (cold and flu season). Contact a doctor if:  You get new symptoms.  You have: ? Chest pain. ? Watery poop (diarrhea). ? A fever.  Your cough gets worse.  You start to have more mucus.  You feel sick to your stomach.  You throw up. Get help right away if you:  Have shortness of breath.  Have trouble breathing.  Have skin or nails that turn a bluish color.  Have very bad pain or stiffness in your neck.  Get a sudden headache.  Get sudden pain in your face or ear.  Cannot eat or drink without throwing up. Summary  Influenza ("the flu") is an infection in the lungs, nose, and throat. It is caused by a virus.  Take over-the-counter and prescription medicines only as told by your doctor.  Getting a flu shot every year is the best way to avoid getting the flu. This information is not intended to replace advice given to you by your health care provider. Make sure you discuss any questions you have with your health care provider. Document Released: 08/13/2008 Document Revised: 04/22/2018 Document Reviewed: 04/22/2018 Elsevier Interactive Patient Education  2019 [employer].

## 2019-02-03 NOTE — Telephone Encounter (Signed)
Noted  

## 2019-02-03 NOTE — Progress Notes (Signed)
Subjective:    Patient ID: Theresa Huber, female    DOB: Jun 11, 1951, 68 y.o.   MRN: [id number]  HPI This is a 68 yo female who presents today with 2 days of cough, stuffy nose, vomiting and subjective fever. She had a sudden onset of symptoms. She did an E-visit and was prescribed a Z-pack. She has taken two doses. Has felt feverish. Has felt achy from cough. Has been taking Mucinex, Tylenol and Zpack. No SOB, no wheeze, feels "raspy." Headache across eyes. Had influenza vaccine this season.   Past Medical History:  Diagnosis Date  . GERD (gastroesophageal reflux disease)   . IBS (irritable bowel syndrome)   . Osteoporosis    T score -2.5 at hip in 2008   Past Surgical History:  Procedure Laterality Date  . ABDOMINAL HYSTERECTOMY    . CESAREAN SECTION  1983  . KNEE ARTHROSCOPY  1990   LEFT  . LIPOMA EXCISION  1980   LEFT THIGH  . OOPHORECTOMY     Family History  Problem Relation Age of Onset  . Diabetes Mother   . Heart disease Mother        ? A FIB  . Cancer Mother        ? sCHWANOMMA, SKIN CANCER  . Sleep apnea Mother   . Dementia Mother 13       Alzheimer's  . Stroke Mother   . Atrial fibrillation Mother   . COPD Father   . Hyperlipidemia Father   . Sleep apnea Father   . Emphysema Father   . Mitral valve prolapse Sister   . Cancer Sister        MELANOMA   Social History   Tobacco Use  . Smoking status: Former Smoker    Packs/day: 2.00    Years: 8.00    Pack years: 16.00    Types: Cigarettes    Last attempt to quit: 11/18/1984    Years since quitting: 34.2  . Smokeless tobacco: Never Used  Substance Use Topics  . Alcohol use: Yes    Comment: occassionally  . Drug use: No      Review of Systems Per HPI    Objective:   Physical Exam Vitals signs reviewed.  Constitutional:      General: She is not in acute distress.    Appearance: Normal appearance. She is obese. She is ill-appearing. She is not toxic-appearing or diaphoretic.  HENT:   Head: Normocephalic and atraumatic.     Right Ear: Tympanic membrane and ear canal normal.     Left Ear: Tympanic membrane and ear canal normal.     Nose: Congestion and rhinorrhea present.     Mouth/Throat:     Mouth: Mucous membranes are moist.     Pharynx: Oropharynx is clear.  Eyes:     Conjunctiva/sclera: Conjunctivae normal.  Neck:     Musculoskeletal: Normal range of motion and neck supple. No neck rigidity or muscular tenderness.  Cardiovascular:     Rate and Rhythm: Normal rate and regular rhythm.     Heart sounds: Normal heart sounds.  Pulmonary:     Effort: Pulmonary effort is normal.     Breath sounds: Normal breath sounds.     Comments: Frequent non productive cough.  Lymphadenopathy:     Cervical: No cervical adenopathy.  Skin:    General: Skin is warm and dry.  Neurological:     Mental Status: She is alert and oriented to person, place, and time.  Psychiatric:  Mood and Affect: Mood normal.        Behavior: Behavior normal.        Thought Content: Thought content normal.        Judgment: Judgment normal.       BP 120/70 (BP Location: Left Arm, Patient Position: Sitting, Cuff Size: Large)   Pulse (!) 101   Temp 99.3 F (37.4 C) (Oral)   Ht 5\' 3"  (1.6 m)   Wt 173 lb (78.5 kg)   SpO2 94%   BMI 30.65 kg/m  Wt Readings from Last 3 Encounters:  02/03/19 173 lb (78.5 kg)  11/30/18 179 lb (81.2 kg)  09/28/18 179 lb 8 oz (81.4 kg)       Results for orders placed or performed in visit on 02/03/19  POC Influenza A&B(BINAX/QUICKVUE)  Result Value Ref Range   Influenza A, POC Positive (A) Negative   Influenza B, POC Negative Negative    Assessment & Plan:  1. Influenza A - Provided written and verbal information regarding diagnosis and treatment. - RTC precautions reviewed - POC Influenza A&B(BINAX/QUICKVUE) - oseltamivir (TAMIFLU) 75 MG capsule; Take 1 capsule (75 mg total) by mouth 2 (two) times daily.  Dispense: 10 capsule; Refill: 0 - Tamiflu  prevention course sent for patient's husband  2. Cough - benzonatate (TESSALON) 100 MG capsule; Take 1-2 capsules (100-200 mg total) by mouth 3 (three) times daily as needed.  Dispense: 30 capsule; Refill: 0   Clarene Reamer, FNP-BC  [facility]  02/03/2019 4:39 PM

## 2019-02-09 ENCOUNTER — Telehealth: Payer: Self-pay | Admitting: *Deleted

## 2019-02-09 NOTE — Telephone Encounter (Signed)
Patient called stating that she was in last week and diagnosed with the flu. Patient stated that she is taking cough medication and cough is not getting any better. Patient stated that she does not know if she has a fever or not because she is taking Tylenol. Patient stated that she has a productive cough/yellow and feels that she has a lot of congestion in her chest. Pharmacy[facility] [address] Tor Netters NP is out of the office today, so will send message to her and PCP

## 2019-02-09 NOTE — Telephone Encounter (Signed)
Patient called back stating that she went to the pharmacy to pick up a cough medication and it was not there. Explained to patient that message had to go to the  provider to determine what needed to be done. Patient stated that all the doctor had to do is look at what was prescribed previously and send it in. Explained to patient that after the provider sees the message she will get a call back with their response.

## 2019-02-10 ENCOUNTER — Telehealth: Payer: Self-pay

## 2019-02-10 ENCOUNTER — Other Ambulatory Visit: Payer: Self-pay | Admitting: Family Medicine

## 2019-02-10 ENCOUNTER — Ambulatory Visit: Payer: Medicare HMO | Admitting: Internal Medicine

## 2019-02-10 DIAGNOSIS — R05 Cough: Secondary | ICD-10-CM

## 2019-02-10 DIAGNOSIS — R110 Nausea: Secondary | ICD-10-CM

## 2019-02-10 DIAGNOSIS — R059 Cough, unspecified: Secondary | ICD-10-CM

## 2019-02-10 MED ORDER — GUAIFENESIN-CODEINE 100-10 MG/5ML PO SYRP: 5.0000 mL | ORAL_SOLUTION | Freq: Three times a day (TID) | ORAL | 0 refills | Status: AC | PRN

## 2019-02-10 MED ORDER — ONDANSETRON 8 MG PO TBDP: 8.0000 mg | ORAL_TABLET | Freq: Three times a day (TID) | ORAL | 0 refills | Status: AC | PRN

## 2019-02-10 NOTE — Telephone Encounter (Signed)
Please confirm that someone is following up with patient---Debbie should be here today

## 2019-02-10 NOTE — Telephone Encounter (Signed)
Pt called no answer voice mail left for pt to call back will try to call her back again later if she does not return my call

## 2019-02-10 NOTE — Telephone Encounter (Signed)
Please call patient and see how she is feeling today. Is she producing a lot of phlegm? What is she taking other than Tylenol for her symptoms?

## 2019-02-10 NOTE — Telephone Encounter (Signed)
Called and spoke to patient who reports that she feels better from influenza stand point except for persistent cough and post nasal drainage which is causing nausea. She is fatigued and moving next week. She is having 2-3 loose stools a day. Yellow sputum and post nasal drip. No fever. Taking Tylenol twice a day, no relief with tessalon perles, also taking mucinex. Eating and drinking without difficulty, good appetite. I sent in guaifenesin with codeine (warned of potential sedation), ondansetron. Encouraged her to take over the counter long acting antihistamine for nasal drainage. She was instructed to follow up if no improvement in 2 days, sooner if worsening symptoms. She was able to converse in complete sentences without sounding SOB.

## 2019-02-10 NOTE — Telephone Encounter (Signed)
Pt called back she has had increased congestion, unsure of fever because of tylenol, having a lot of phlegm, pt is taken mucinex, tylenol and tessalion pereles, pt stated this has been going on for 2 weeks she has had everything from vomiting and diarrhea, body aches,pt became up set when I was asking her about symptoms and started yelling, pt informed that I would let the provider know how she was doing and I would call her back if anything was ordered for her

## 2019-02-26 ENCOUNTER — Other Ambulatory Visit: Payer: Self-pay | Admitting: Internal Medicine

## 2019-03-01 MED ORDER — PANTOPRAZOLE SODIUM 40 MG PO TBEC: 40.0000 mg | DELAYED_RELEASE_TABLET | Freq: Every day | ORAL | 1 refills | Status: AC

## 2019-03-02 ENCOUNTER — Telehealth: Payer: Self-pay

## 2019-03-02 NOTE — Telephone Encounter (Signed)
Inbound call to triage - medication management  Recently relocated from [place] to [facility]. Has lost prescription for Pantoprazole 40 mg by mouth daily. Patient has requested prescription be sent to:   [facility], [place] [address] Phone: [phone]

## 2019-03-02 NOTE — Telephone Encounter (Signed)
I filled that yesterday at the request of the pharmacy. Spoke to pt.

## 2019-03-02 NOTE — Telephone Encounter (Signed)
Okay #[phone] x [phone] Should establish with new doctor and get further refills from them

## 2019-03-22 DIAGNOSIS — Z1331 Encounter for screening for depression: Secondary | ICD-10-CM | POA: Diagnosis not present

## 2019-03-22 DIAGNOSIS — Z6832 Body mass index (BMI) 32.0-32.9, adult: Secondary | ICD-10-CM | POA: Diagnosis not present

## 2019-03-22 DIAGNOSIS — E785 Hyperlipidemia, unspecified: Secondary | ICD-10-CM | POA: Diagnosis not present

## 2019-03-22 DIAGNOSIS — K219 Gastro-esophageal reflux disease without esophagitis: Secondary | ICD-10-CM | POA: Diagnosis not present

## 2019-03-22 DIAGNOSIS — H6121 Impacted cerumen, right ear: Secondary | ICD-10-CM | POA: Diagnosis not present

## 2019-04-01 DIAGNOSIS — D72829 Elevated white blood cell count, unspecified: Secondary | ICD-10-CM | POA: Diagnosis not present

## 2019-04-01 DIAGNOSIS — E785 Hyperlipidemia, unspecified: Secondary | ICD-10-CM | POA: Diagnosis not present

## 2019-04-01 DIAGNOSIS — Z6832 Body mass index (BMI) 32.0-32.9, adult: Secondary | ICD-10-CM | POA: Diagnosis not present

## 2019-04-01 DIAGNOSIS — R748 Abnormal levels of other serum enzymes: Secondary | ICD-10-CM | POA: Diagnosis not present

## 2019-04-01 DIAGNOSIS — K219 Gastro-esophageal reflux disease without esophagitis: Secondary | ICD-10-CM | POA: Diagnosis not present

## 2019-09-08 LAB — FECAL OCCULT BLOOD, IMMUNOCHEMICAL: IFOBT: NEGATIVE

## 2019-09-28 ENCOUNTER — Encounter: Payer: Self-pay | Admitting: Internal Medicine

## 2019-10-04 ENCOUNTER — Encounter: Payer: Medicare HMO | Admitting: Internal Medicine

## 2019-11-14 ENCOUNTER — Other Ambulatory Visit: Payer: Self-pay | Admitting: Internal Medicine

## 2019-11-15 ENCOUNTER — Other Ambulatory Visit: Payer: Self-pay | Admitting: Internal Medicine

## 2019-11-23 ENCOUNTER — Encounter: Payer: Self-pay | Admitting: Internal Medicine
